# Patient Record
Sex: Female | Born: 1955 | Race: White | Hispanic: No | Marital: Married | Smoking: Former smoker
Health system: Southern US, Community
[De-identification: ages and names within clinical notes are randomized; demographics above are authoritative.]

## PROBLEM LIST (undated history)

## (undated) DIAGNOSIS — K219 Gastro-esophageal reflux disease without esophagitis: Secondary | ICD-10-CM

## (undated) DIAGNOSIS — Z973 Presence of spectacles and contact lenses: Secondary | ICD-10-CM

## (undated) DIAGNOSIS — Z8489 Family history of other specified conditions: Secondary | ICD-10-CM

## (undated) DIAGNOSIS — E7800 Pure hypercholesterolemia, unspecified: Secondary | ICD-10-CM

## (undated) DIAGNOSIS — Z789 Other specified health status: Secondary | ICD-10-CM

## (undated) HISTORY — PX: COLONOSCOPY: SHX174

## (undated) HISTORY — DX: Gastro-esophageal reflux disease without esophagitis: K21.9

## (undated) HISTORY — PX: MOUTH SURGERY: SHX715

## (undated) HISTORY — PX: FINGER SURGERY: SHX640

## (undated) HISTORY — DX: Pure hypercholesterolemia, unspecified: E78.00

---

## 2007-03-18 ENCOUNTER — Other Ambulatory Visit: Admission: RE | Admit: 2007-03-18 | Discharge: 2007-03-18 | Payer: Self-pay | Admitting: Obstetrics & Gynecology

## 2008-03-05 ENCOUNTER — Other Ambulatory Visit: Admission: RE | Admit: 2008-03-05 | Discharge: 2008-03-05 | Payer: Self-pay | Admitting: Obstetrics & Gynecology

## 2009-04-05 ENCOUNTER — Other Ambulatory Visit: Admission: RE | Admit: 2009-04-05 | Discharge: 2009-04-05 | Payer: Self-pay | Admitting: Obstetrics & Gynecology

## 2010-02-27 ENCOUNTER — Ambulatory Visit: Admission: RE | Admit: 2010-02-27 | Discharge: 2010-02-27 | Payer: Self-pay | Admitting: Internal Medicine

## 2013-05-22 ENCOUNTER — Other Ambulatory Visit: Payer: Self-pay | Admitting: Orthopedic Surgery

## 2013-06-06 ENCOUNTER — Encounter (HOSPITAL_BASED_OUTPATIENT_CLINIC_OR_DEPARTMENT_OTHER): Payer: Self-pay | Admitting: *Deleted

## 2013-06-06 NOTE — Progress Notes (Signed)
Pt denies any cardiac or resp problems

## 2013-06-13 ENCOUNTER — Ambulatory Visit (HOSPITAL_BASED_OUTPATIENT_CLINIC_OR_DEPARTMENT_OTHER): Payer: BC Managed Care – PPO | Admitting: Anesthesiology

## 2013-06-13 ENCOUNTER — Encounter (HOSPITAL_BASED_OUTPATIENT_CLINIC_OR_DEPARTMENT_OTHER): Payer: Self-pay | Admitting: *Deleted

## 2013-06-13 ENCOUNTER — Encounter (HOSPITAL_BASED_OUTPATIENT_CLINIC_OR_DEPARTMENT_OTHER)
Admission: RE | Disposition: A | Discharge status: Home / Self Care | Payer: Self-pay | Source: Ambulatory Visit | Attending: Orthopedic Surgery

## 2013-06-13 ENCOUNTER — Ambulatory Visit (HOSPITAL_BASED_OUTPATIENT_CLINIC_OR_DEPARTMENT_OTHER)
Admission: RE | Admit: 2013-06-13 | Discharge: 2013-06-13 | Disposition: A | Discharge status: Home / Self Care | Payer: BC Managed Care – PPO | Source: Ambulatory Visit | Attending: Orthopedic Surgery | Admitting: Orthopedic Surgery

## 2013-06-13 ENCOUNTER — Encounter (HOSPITAL_BASED_OUTPATIENT_CLINIC_OR_DEPARTMENT_OTHER): Payer: Self-pay | Admitting: Anesthesiology

## 2013-06-13 DIAGNOSIS — Z79899 Other long term (current) drug therapy: Secondary | ICD-10-CM | POA: Insufficient documentation

## 2013-06-13 DIAGNOSIS — Z87891 Personal history of nicotine dependence: Secondary | ICD-10-CM | POA: Insufficient documentation

## 2013-06-13 DIAGNOSIS — Z6841 Body Mass Index (BMI) 40.0 and over, adult: Secondary | ICD-10-CM | POA: Insufficient documentation

## 2013-06-13 DIAGNOSIS — D2110 Benign neoplasm of connective and other soft tissue of unspecified upper limb, including shoulder: Secondary | ICD-10-CM | POA: Insufficient documentation

## 2013-06-13 HISTORY — DX: Other specified health status: Z78.9

## 2013-06-13 HISTORY — DX: Family history of other specified conditions: Z84.89

## 2013-06-13 HISTORY — DX: Presence of spectacles and contact lenses: Z97.3

## 2013-06-13 SURGERY — EXCISION METACARPAL MASS
Anesthesia: Regional | Site: Hand | Laterality: Left | Wound class: Clean

## 2013-06-13 MED ORDER — CHLORHEXIDINE GLUCONATE 4 % EX LIQD: 60.0000 mL | Freq: Once | CUTANEOUS | Status: DC

## 2013-06-13 MED ORDER — PROPOFOL INFUSION 10 MG/ML OPTIME
INTRAVENOUS | Status: DC | PRN
  Administered 2013-06-13: 100 ug/kg/min via INTRAVENOUS

## 2013-06-13 MED ORDER — MIDAZOLAM HCL 2 MG/2ML IJ SOLN: 0.5000 mg | Freq: Once | INTRAMUSCULAR | Status: DC | PRN

## 2013-06-13 MED ORDER — HYDROCODONE-ACETAMINOPHEN 5-325 MG PO TABS: 1.0000 | ORAL_TABLET | Freq: Four times a day (QID) | ORAL | Status: DC | PRN

## 2013-06-13 MED ORDER — CEFAZOLIN SODIUM-DEXTROSE 2-3 GM-% IV SOLR: 2.0000 g | INTRAVENOUS | Status: DC

## 2013-06-13 MED ORDER — FENTANYL CITRATE 0.05 MG/ML IJ SOLN: 25.0000 ug | INTRAMUSCULAR | Status: DC | PRN

## 2013-06-13 MED ORDER — PROMETHAZINE HCL 25 MG/ML IJ SOLN: 6.2500 mg | INTRAMUSCULAR | Status: DC | PRN

## 2013-06-13 MED ORDER — MIDAZOLAM HCL 5 MG/5ML IJ SOLN
INTRAMUSCULAR | Status: DC | PRN
  Administered 2013-06-13: 1 mg via INTRAVENOUS

## 2013-06-13 MED ORDER — MEPERIDINE HCL 25 MG/ML IJ SOLN: 6.2500 mg | INTRAMUSCULAR | Status: DC | PRN

## 2013-06-13 MED ORDER — BUPIVACAINE HCL (PF) 0.25 % IJ SOLN
INTRAMUSCULAR | Status: DC | PRN
  Administered 2013-06-13: 7 mL

## 2013-06-13 MED ORDER — FENTANYL CITRATE 0.05 MG/ML IJ SOLN
INTRAMUSCULAR | Status: DC | PRN
  Administered 2013-06-13: 50 ug via INTRAVENOUS

## 2013-06-13 MED ORDER — LACTATED RINGERS IV SOLN
INTRAVENOUS | Status: DC
  Administered 2013-06-13: 08:00:00 via INTRAVENOUS

## 2013-06-13 MED ORDER — OXYCODONE HCL 5 MG PO TABS: 5.0000 mg | ORAL_TABLET | Freq: Once | ORAL | Status: DC | PRN

## 2013-06-13 MED ORDER — LIDOCAINE HCL (CARDIAC) 20 MG/ML IV SOLN
INTRAVENOUS | Status: DC | PRN
  Administered 2013-06-13: 50 mg via INTRAVENOUS

## 2013-06-13 MED ORDER — OXYCODONE HCL 5 MG/5ML PO SOLN: 5.0000 mg | Freq: Once | ORAL | Status: DC | PRN

## 2013-06-13 MED ORDER — ONDANSETRON HCL 4 MG/2ML IJ SOLN
INTRAMUSCULAR | Status: DC | PRN
  Administered 2013-06-13: 4 mg via INTRAVENOUS

## 2013-06-13 SURGICAL SUPPLY — 50 items
BANDAGE COBAN STERILE 2 (GAUZE/BANDAGES/DRESSINGS) IMPLANT
BANDAGE GAUZE ELAST BULKY 4 IN (GAUZE/BANDAGES/DRESSINGS) IMPLANT
BLADE MINI RND TIP GREEN BEAV (BLADE) IMPLANT
BLADE SURG 15 STRL LF DISP TIS (BLADE) ×1 IMPLANT
BLADE SURG 15 STRL SS (BLADE) ×2
BNDG CMPR 9X4 STRL LF SNTH (GAUZE/BANDAGES/DRESSINGS)
BNDG COHESIVE 1X5 TAN STRL LF (GAUZE/BANDAGES/DRESSINGS) ×1 IMPLANT
BNDG COHESIVE 3X5 TAN STRL LF (GAUZE/BANDAGES/DRESSINGS) IMPLANT
BNDG ESMARK 4X9 LF (GAUZE/BANDAGES/DRESSINGS) IMPLANT
CHLORAPREP W/TINT 26ML (MISCELLANEOUS) ×2 IMPLANT
CLOTH BEACON ORANGE TIMEOUT ST (SAFETY) ×2 IMPLANT
CORDS BIPOLAR (ELECTRODE) ×2 IMPLANT
COVER MAYO STAND STRL (DRAPES) ×2 IMPLANT
COVER TABLE BACK 60X90 (DRAPES) ×2 IMPLANT
CUFF TOURNIQUET SINGLE 18IN (TOURNIQUET CUFF) IMPLANT
DECANTER SPIKE VIAL GLASS SM (MISCELLANEOUS) IMPLANT
DRAIN PENROSE 1/2X12 LTX STRL (WOUND CARE) IMPLANT
DRAPE EXTREMITY T 121X128X90 (DRAPE) ×2 IMPLANT
DRAPE SURG 17X23 STRL (DRAPES) ×2 IMPLANT
GAUZE XEROFORM 1X8 LF (GAUZE/BANDAGES/DRESSINGS) ×2 IMPLANT
GLOVE BIO SURGEON STRL SZ 6.5 (GLOVE) ×2 IMPLANT
GLOVE BIOGEL PI IND STRL 8.5 (GLOVE) ×1 IMPLANT
GLOVE BIOGEL PI INDICATOR 8.5 (GLOVE) ×1
GLOVE SURG ORTHO 8.0 STRL STRW (GLOVE) ×2 IMPLANT
GOWN BRE IMP PREV XXLGXLNG (GOWN DISPOSABLE) ×2 IMPLANT
GOWN PREVENTION PLUS XLARGE (GOWN DISPOSABLE) ×2 IMPLANT
NDL SAFETY ECLIPSE 18X1.5 (NEEDLE) ×1 IMPLANT
NEEDLE 27GAX1X1/2 (NEEDLE) ×1 IMPLANT
NEEDLE HYPO 18GX1.5 SHARP (NEEDLE) ×2
NS IRRIG 1000ML POUR BTL (IV SOLUTION) ×2 IMPLANT
PACK BASIN DAY SURGERY FS (CUSTOM PROCEDURE TRAY) ×2 IMPLANT
PAD CAST 3X4 CTTN HI CHSV (CAST SUPPLIES) IMPLANT
PADDING CAST ABS 3INX4YD NS (CAST SUPPLIES)
PADDING CAST ABS 4INX4YD NS (CAST SUPPLIES)
PADDING CAST ABS COTTON 3X4 (CAST SUPPLIES) IMPLANT
PADDING CAST ABS COTTON 4X4 ST (CAST SUPPLIES) ×1 IMPLANT
PADDING CAST COTTON 3X4 STRL (CAST SUPPLIES)
SPLINT FNGR PLAIN END 5/8X3.25 (CAST SUPPLIES) IMPLANT
SPLINT PLASTALUME 3 1/4 (CAST SUPPLIES) ×2
SPLINT PLASTER CAST XFAST 3X15 (CAST SUPPLIES) IMPLANT
SPLINT PLASTER XTRA FASTSET 3X (CAST SUPPLIES)
SPONGE GAUZE 4X4 12PLY (GAUZE/BANDAGES/DRESSINGS) ×2 IMPLANT
STOCKINETTE 4X48 STRL (DRAPES) ×2 IMPLANT
SUT VIC AB 4-0 P2 18 (SUTURE) IMPLANT
SUT VICRYL RAPID 5 0 P 3 (SUTURE) IMPLANT
SUT VICRYL RAPIDE 4/0 PS 2 (SUTURE) ×2 IMPLANT
SYR BULB 3OZ (MISCELLANEOUS) ×2 IMPLANT
SYR CONTROL 10ML LL (SYRINGE) IMPLANT
TOWEL OR 17X24 6PK STRL BLUE (TOWEL DISPOSABLE) ×4 IMPLANT
UNDERPAD 30X30 INCONTINENT (UNDERPADS AND DIAPERS) ×2 IMPLANT

## 2013-06-13 NOTE — Transfer of Care (Signed)
Immediate Anesthesia Transfer of Care Note  Patient: Sarah [name]  Procedure(s) Performed: Procedure(s): EXCISION MASS LEFT INDEX FINGER (Left)  Patient Location: PACU  Anesthesia Type:MAC and Bier block  Level of Consciousness: awake and alert   Airway & Oxygen Therapy: Patient Spontanous Breathing and Patient connected to face mask oxygen  Post-op Assessment: Report given to PACU RN and Post -op Vital signs reviewed and stable  Post vital signs: Reviewed and stable  Complications: No apparent anesthesia complications

## 2013-06-13 NOTE — H&P (Signed)
  Sarah [name] is a 57 year-old right-hand dominant female with a mass on the radial aspect DIP joint of her left index finger.  This has been present for approximately one year.  She recalls no specific history of injury. She has no history of diabetes, thyroid problems, arthritis or gout . She complains of mild, dull aching type pain. She has not had any treatment or tried anything for this.    ALLERGIES:    None.  MEDICATIONS:    Estradiol, medroxyprogesterone and fluoxetine.  SURGICAL HISTORY:    None.  FAMILY MEDICAL HISTORY:    Positive for arthritis.      SOCIAL HISTORY:     She does no smoke or drink.  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  REVIEW OF SYSTEMS:     Positive for glasses, otherwise negative 14 points.  Sarah [name] is an 57 y.o. female.   Chief Complaint: mass LIF  HPI: see above  Past Medical History  Diagnosis Date  . Medical history non-contributory   . Wears glasses   . Family history of anesthesia complication     father hard to wake up    Past Surgical History  Procedure Laterality Date  . Colonoscopy      History reviewed. No pertinent family history. Social History:  reports that she quit smoking about 16 years ago. She does not have any smokeless tobacco history on file. She reports that  drinks alcohol. She reports that she does not use illicit drugs.  Allergies: No Known Allergies  Medications Prior to Admission  Medication Sig Dispense Refill  . estradiol (ESTRACE) 0.5 MG tablet Take 0.5 mg by mouth every evening.      Marland Kitchen FLUoxetine (PROZAC) 10 MG capsule Take 20 mg by mouth every evening.       . medroxyPROGESTERone (PROVERA) 2.5 MG tablet Take 2.5 mg by mouth every evening.        No results found for this or any previous visit (from the past 48 hour(s)).  No results found.   Pertinent items are noted in HPI.  Blood pressure 150/87, pulse 55, temperature 97.5 F (36.4 C), temperature source Oral, resp. rate 18,  height 5\' 7"  (1.702 m), weight 120.022 kg (264 lb 9.6 oz), SpO2 100.00%.  General appearance: alert, cooperative and appears stated age Head: Normocephalic, without obvious abnormality Neck: no JVD Resp: clear to auscultation bilaterally Cardio: regular rate and rhythm, S1, S2 normal, no murmur, click, rub or gallop GI: soft, non-tender; bowel sounds normal; no masses,  no organomegaly Extremities: extremities normal, atraumatic, no cyanosis or edema Pulses: 2+ and symmetric Skin: Skin color, texture, turgor normal. No rashes or lesions Neurologic: Grossly normal Incision/Wound: na  Assessment/Plan RADIOGRAPHS:     X-rays reveal degenerative  changes of  both PIP, DIP joints.  DIAGNOSIS:     Soft tissue tumor unspecified.   RECOMMENDATIONS/PLAN:    She is desirous of having this removed.  We have discussed the possibility of recurrence, injury to arteries, nerves, tendons, incomplete relief of symptoms, dystrophy, and possibility of infection.  This is scheduled for excision mass, left index finger, as an outpatient under regional anesthesia.  Sarah [name] R 06/13/2013, 7:35 AM

## 2013-06-13 NOTE — Anesthesia Postprocedure Evaluation (Signed)
  Anesthesia Post-op Note  Patient: Sarah [name]  Procedure(s) Performed: Procedure(s): EXCISION MASS LEFT INDEX FINGER (Left)  Patient Location: PACU  Anesthesia Type:Bier block  Level of Consciousness: awake, alert , oriented and patient cooperative  Airway and Oxygen Therapy: Patient Spontanous Breathing  Post-op Pain: none  Post-op Assessment: Post-op Vital signs reviewed, Patient's Cardiovascular Status Stable, Respiratory Function Stable, Patent Airway, No signs of Nausea or vomiting and Pain level controlled  Post-op Vital Signs: Reviewed and stable  Complications: No apparent anesthesia complications

## 2013-06-13 NOTE — Brief Op Note (Signed)
06/13/2013  9:03 AM  PATIENT:  Sarah [name]  57 y.o. female  PRE-OPERATIVE DIAGNOSIS:  MASS LEFT INDEX FINGER  POST-OPERATIVE DIAGNOSIS:  MASS LEFT INDEX FINGER  PROCEDURE:  Procedure(s): EXCISION MASS LEFT INDEX FINGER (Left)  SURGEON:  Surgeon(s) and Role:    * Nicki Reaper, MD - Primary  PHYSICIAN ASSISTANT:   ASSISTANTS: none   ANESTHESIA:   local and regional  EBL:     BLOOD ADMINISTERED:none  DRAINS: none   LOCAL MEDICATIONS USED:  MARCAINE     SPECIMEN:  excision  DISPOSITION OF SPECIMEN:  PATHOLOGY  COUNTS:  YES  TOURNIQUET:   Total Tourniquet Time Documented: Forearm (Left) - 23 minutes Total: Forearm (Left) - 23 minutes   DICTATION: .Other Dictation: Dictation Number [id number]  PLAN OF CARE: Discharge to home after PACU  PATIENT DISPOSITION:  PACU - hemodynamically stable.

## 2013-06-13 NOTE — Op Note (Signed)
Dictation Number [id number]

## 2013-06-13 NOTE — Anesthesia Preprocedure Evaluation (Addendum)
Anesthesia Evaluation  Patient identified by MRN, date of birth, ID band Patient awake    Reviewed: Allergy & Precautions, H&P , NPO status , Patient's Chart, lab work & pertinent test results  History of Anesthesia Complications Negative for: history of anesthetic complications  Airway Mallampati: II TM Distance: >3 FB Neck ROM: Full    Dental  (+) Poor Dentition and Dental Advisory Given   Pulmonary former smoker,  breath sounds clear to auscultation  Pulmonary exam normal       Cardiovascular negative cardio ROS  Rhythm:Regular Rate:Normal     Neuro/Psych negative neurological ROS     GI/Hepatic negative GI ROS, Neg liver ROS,   Endo/Other  Morbid obesity  Renal/GU negative Renal ROS     Musculoskeletal   Abdominal (+) + obese,   Peds  Hematology negative hematology ROS (+)   Anesthesia Other Findings   Reproductive/Obstetrics                          Anesthesia Physical Anesthesia Plan  ASA: II  Anesthesia Plan: MAC and Bier Block   Post-op Pain Management:    Induction:   Airway Management Planned: Simple Face Mask  Additional Equipment:   Intra-op Plan:   Post-operative Plan:   Informed Consent: I have reviewed the patients History and Physical, chart, labs and discussed the procedure including the risks, benefits and alternatives for the proposed anesthesia with the patient or authorized representative who has indicated his/her understanding and acceptance.   Dental advisory given  Plan Discussed with: CRNA and Surgeon  Anesthesia Plan Comments: (Plan routine monitors, IV regional Lidocaine with MAC)        Anesthesia Quick Evaluation

## 2013-06-13 NOTE — Addendum Note (Signed)
Addendum created 06/13/13 1018 by Germaine Pomfret, MD   Modules edited: Anesthesia Attestations, Anesthesia Events

## 2013-06-15 NOTE — Op Note (Signed)
NAME:  Sarah [name], Sarah [name]                      ACCOUNT NO.:  MEDICAL RECORD NO.:  LOCATION:                                 FACILITY:  PHYSICIAN:  Cindee Salt, M.D.            DATE OF BIRTH:  DATE OF PROCEDURE:  06/13/2013 DATE OF DISCHARGE:                              OPERATIVE REPORT   PREOPERATIVE DIAGNOSIS:  Mass, left index finger.  POSTOPERATIVE DIAGNOSIS:  Mass, left index finger.  OPERATION:  Excisional biopsy, mass, left index finger.  SURGEON:  Cindee Salt, MD  ANESTHESIA:  Forearm-based IV regional with metacarpal block.  ANESTHESIOLOGISTJean Rosenthal.  HISTORY:  The patient is a 57 year old female with a history of a mass on the radial aspect, distal interphalangeal joint of her left index finger.  She is desirous of excision of this.  Pre, peri, postoperative course have been discussed along with risks and complications.  She is aware there is no guarantee with the surgery; possibility of infection; recurrence of injury to arteries, nerves, tendons, incomplete relief of symptoms, dystrophy.  In the preoperative area, the patient is seen, the extremity marked by both patient and surgeon.  Antibiotic given.  PROCEDURE IN DETAIL:  The patient was brought to the operating room where a forearm-based IV regional anesthetic was carried out without difficulty.  She was prepped using ChloraPrep, supine position with the left arm free.  A 3-minute dry time was allowed.  Time-out taken, confirming patient procedure.  The index finger, left hand was then given metacarpal block with 0.25% Marcaine without epinephrine, 8 mL was used.  An elliptical incision was made over the mass carried down through subcutaneous tissue with blunt and sharp dissection, this was dissected free and sent to Pathology, taking care to protect neurovascular structures.  The wound was irrigated, undermined, and closed with interrupted 5-0 Vicryl Rapide sutures.  Sterile compressive dressing and splint to  the index finger was applied.  On deflation of the tourniquet, the remaining fingers pinked.  She was taken to the recovery room for observation in satisfactory condition.  She will be discharged home to return in 1 week on Norco.          ______________________________ Cindee Salt, M.D.     GK/MEDQ  D:  06/13/2013  T:  06/13/2013  Job:  [id number]

## 2013-08-19 ENCOUNTER — Other Ambulatory Visit: Payer: Self-pay | Admitting: Certified Nurse Midwife

## 2013-08-21 NOTE — Telephone Encounter (Signed)
Pt has aex 10/14

## 2013-08-22 ENCOUNTER — Ambulatory Visit: Payer: Self-pay | Admitting: Certified Nurse Midwife

## 2013-08-25 ENCOUNTER — Encounter: Payer: Self-pay | Admitting: Certified Nurse Midwife

## 2013-08-28 ENCOUNTER — Ambulatory Visit (INDEPENDENT_AMBULATORY_CARE_PROVIDER_SITE_OTHER): Payer: BC Managed Care – PPO | Admitting: Certified Nurse Midwife

## 2013-08-28 ENCOUNTER — Encounter: Payer: Self-pay | Admitting: Certified Nurse Midwife

## 2013-08-28 VITALS — BP 126/72 | HR 68 | Resp 16 | Ht 67.0 in | Wt 268.0 lb

## 2013-08-28 DIAGNOSIS — Z01419 Encounter for gynecological examination (general) (routine) without abnormal findings: Secondary | ICD-10-CM

## 2013-08-28 DIAGNOSIS — Z0000 Encounter for general adult medical examination without abnormal findings: Secondary | ICD-10-CM

## 2013-08-28 DIAGNOSIS — N3946 Mixed incontinence: Secondary | ICD-10-CM

## 2013-08-28 DIAGNOSIS — N951 Menopausal and female climacteric states: Secondary | ICD-10-CM

## 2013-08-28 MED ORDER — ESTRADIOL 0.5 MG PO TABS: 0.5000 mg | ORAL_TABLET | Freq: Every day | ORAL | Status: DC

## 2013-08-28 NOTE — Patient Instructions (Signed)

## 2013-08-28 NOTE — Progress Notes (Signed)
57 y.o. G0P0000 Married Caucasian Fe here for annual exam.  Menopausal on HRT, would like to decrease dose. Denies vaginal bleeding or vaginal dryness. Sees PCP for aex and all labs. Per patient normal. No health issues today, except continues with mixed incontinence at times. Wears a pad most days. Denies frequency, urgency or pain. Denies excessive caffeine use. Still working on weight loss. Ran her first 5K!  Patient's last menstrual period was 05/23/2008.          Sexually active: no  The current method of family planning is post menopausal status.    Exercising: no  exercise Smoker:  no  Health Maintenance: Pap:  08-16-12 ASCUS -HPV MMG: 04-28-13 normal Colonoscopy:  2008 repeat in 10 years BMD:   2010 TDaP:  2009 Labs: none Self breast exam: not done   reports that she quit smoking about 16 years ago. She does not have any smokeless tobacco history on file. She reports that she does not drink alcohol or use illicit drugs.  Past Medical History  Diagnosis Date  . Medical history non-contributory   . Wears glasses   . Family history of anesthesia complication     father hard to wake up  . GERD (gastroesophageal reflux disease)   . Hypercholesteremia     Past Surgical History  Procedure Laterality Date  . Colonoscopy    . Finger surgery      removal of bump on finger, benign    Current Outpatient Prescriptions  Medication Sig Dispense Refill  . estradiol (ESTRACE) 0.5 MG tablet TAKE ONE TABLET DAILY.  90 tablet  0  . FLUoxetine (PROZAC) 20 MG capsule Take 20 mg by mouth daily.      . medroxyPROGESTERone (PROVERA) 2.5 MG tablet Take 2.5 mg by mouth every evening.       No current facility-administered medications for this visit.    Family History  Problem Relation Age of Onset  . Cancer Mother     skin  . Cancer Father     skin  . Stroke Maternal Grandmother   . Stroke Paternal Grandmother     ROS:  Pertinent items are noted in HPI.  Otherwise, a comprehensive  ROS was negative.  Exam:   BP 126/72  Pulse 68  Resp 16  Ht 5\' 7"  (1.702 m)  Wt 268 lb (121.564 kg)  BMI 41.96 kg/m2  LMP 05/23/2008 Height: 5\' 7"  (170.2 cm)  Ht Readings from Last 3 Encounters:  08/28/13 5\' 7"  (1.702 m)  06/13/13 5\' 7"  (1.702 m)  06/13/13 5\' 7"  (1.702 m)    General appearance: alert, cooperative and appears stated age Head: Normocephalic, without obvious abnormality, atraumatic Neck: no adenopathy, supple, symmetrical, trachea midline and thyroid normal to inspection and palpation Lungs: clear to auscultation bilaterally Breasts: normal appearance, no masses or tenderness, No nipple retraction or dimpling, No nipple discharge or bleeding, No axillary or supraclavicular adenopathy Heart: regular rate and rhythm Abdomen: soft, non-tender; no masses,  no organomegaly Extremities: extremities normal, atraumatic, no cyanosis or edema Skin: Skin color, texture, turgor normal. No rashes or lesions Lymph nodes: Cervical, supraclavicular, and axillary nodes normal. No abnormal inguinal nodes palpated Neurologic: Grossly normal   Pelvic: External genitalia:  no lesions              Urethra:  normal appearing urethra with no masses, tenderness or lesions              Bartholin's and Skene's: normal  Vagina: normal appearing vagina with normal color and discharge, no lesions              Cervix: normal, non tender              Pap taken: yes Bimanual Exam:  Uterus:  normal size, contour, position, consistency, mobility, non-tender and anteverted              Adnexa: normal adnexa and no mass, fullness, tenderness               Rectovaginal: Confirms               Anus:  normal sphincter tone, no lesions  A:  Well Woman with normal exam  Menopausal on HRT desires decrease dose  History of ASCUS pap with negaitve HPVHR  Mixed incontinence  P:   Reviewed health and wellness pertinent to exam  Aware of need to evaluate if vaginal bleeding  Discussed  taking 1/2 tablet of Estrace and continuing on same Provera dose. Discussed that symptoms might return but should adjust to after 2-3 weeks. Patient will advise if problems.  Discussed importance of kegel exercise, encouraged to work on and try to limit pad use to decrease skin irritation risk.  ZOX:WRUEA culture  Pap smear as per guidelines   Mammogram yearly pap smear taken today with HPVHR   counseled on breast self exam, mammography screening, use and side effects of HRT, menopause, adequate intake of calcium and vitamin D, diet and exercise, Kegel's exercises  return annually or prn  An After Visit Summary was printed and given to the patient.

## 2013-08-29 ENCOUNTER — Encounter: Payer: Self-pay | Admitting: Certified Nurse Midwife

## 2013-08-29 MED ORDER — MEDROXYPROGESTERONE ACETATE 2.5 MG PO TABS: 2.5000 mg | ORAL_TABLET | Freq: Every evening | ORAL | Status: DC

## 2013-08-29 NOTE — Progress Notes (Signed)
Note reviewed, agree with plan.  Jung Ingerson, MD  

## 2013-08-30 LAB — CULTURE, URINE COMPREHENSIVE
Colony Count: NO GROWTH
Organism ID, Bacteria: NO GROWTH

## 2013-09-28 ENCOUNTER — Other Ambulatory Visit: Payer: Self-pay

## 2013-10-13 ENCOUNTER — Ambulatory Visit: Payer: BC Managed Care – PPO | Admitting: Podiatrist

## 2013-10-13 ENCOUNTER — Encounter: Payer: Self-pay | Admitting: Podiatrist

## 2013-10-13 VITALS — BP 154/88 | HR 64 | Resp 16 | Ht 67.0 in | Wt 270.0 lb

## 2013-10-13 DIAGNOSIS — B351 Tinea unguium: Secondary | ICD-10-CM

## 2013-10-13 NOTE — Progress Notes (Signed)
Patient presents for Laser retreat bilateral hallux nails.  Patient states she has noticed nice improvement in the nails.    Laserering of Toenails bilateral hallux nails carried out at today's visit via Q-switch YAG laser by [employer] at continuous Patient and staff were wearing appropriate laser protective goggles/eyewear Laser device was tested prior to use and safety protocols were followed Frequency of 5 Hz, Level 4, Joules 7.5 delivered.     The patient tolerated the lasering well and will call if any concerns arise.

## 2014-08-30 ENCOUNTER — Encounter: Payer: Self-pay | Admitting: Certified Nurse Midwife

## 2014-08-30 ENCOUNTER — Ambulatory Visit (INDEPENDENT_AMBULATORY_CARE_PROVIDER_SITE_OTHER): Payer: BC Managed Care – PPO | Admitting: Certified Nurse Midwife

## 2014-08-30 VITALS — BP 118/70 | HR 72 | Resp 16 | Ht 66.75 in | Wt 266.0 lb

## 2014-08-30 DIAGNOSIS — Z01419 Encounter for gynecological examination (general) (routine) without abnormal findings: Secondary | ICD-10-CM

## 2014-08-30 DIAGNOSIS — Z0000 Encounter for general adult medical examination without abnormal findings: Secondary | ICD-10-CM

## 2014-08-30 LAB — POCT URINALYSIS DIPSTICK
Bilirubin, UA: NEGATIVE
Glucose, UA: NEGATIVE
KETONES UA: NEGATIVE
Leukocytes, UA: NEGATIVE
Nitrite, UA: NEGATIVE
PH UA: 5
PROTEIN UA: NEGATIVE
RBC UA: NEGATIVE
Urobilinogen, UA: NEGATIVE

## 2014-08-30 MED ORDER — IMIQUIMOD 5 % EX CREA: TOPICAL_CREAM | CUTANEOUS | Status: DC

## 2014-08-30 NOTE — Progress Notes (Signed)
58 y.o. G0P0000 Married Caucasian Fe here for annual exam. Menopausal no HRT. Denies vaginal bleeding or vaginal dryness. Sees PCP twice yearly with labs, all normal. No medications. Patient studying for exams for job. Working on weight loss and exercise with trainer. Determined to continue working on!! No other health issues today.  Patient's last menstrual period was 05/23/2008.          Sexually active: No.  The current method of family planning is post menopausal status.    Exercising: Yes.    workout with trainer Smoker:  no  Health Maintenance: Pap: 08-28-13 neg HPV HR neg MMG:  04-30-14 composition category b, birads category 1:neg Colonoscopy: 2008 f/u 50yrs BMD:   2010 TDaP:  2009 Labs: Poct urine-neg Self breast exam: not done   reports that she quit smoking about 17 years ago. She has never used smokeless tobacco. She reports that she does not drink alcohol or use illicit drugs.  Past Medical History  Diagnosis Date  . Medical history non-contributory   . Wears glasses   . Family history of anesthesia complication     father hard to wake up  . GERD (gastroesophageal reflux disease)   . Hypercholesteremia     Past Surgical History  Procedure Laterality Date  . Colonoscopy    . Finger surgery      removal of bump on finger, benign    No current outpatient prescriptions on file.   No current facility-administered medications for this visit.    Family History  Problem Relation Age of Onset  . Cancer Mother     skin  . Cancer Father     skin  . Stroke Maternal Grandmother   . Stroke Paternal Grandmother     ROS:  Pertinent items are noted in HPI.  Otherwise, a comprehensive ROS was negative.  Exam:   BP 118/70  Pulse 72  Resp 16  Ht 5' 6.75" (1.695 m)  Wt 266 lb (120.657 kg)  BMI 42.00 kg/m2  LMP 05/23/2008 Height: 5' 6.75" (169.5 cm)  Ht Readings from Last 3 Encounters:  08/30/14 5' 6.75" (1.695 m)  10/13/13 5\' 7"  (1.702 m)  08/28/13 5\' 7"  (1.702  m)    General appearance: alert, cooperative and appears stated age Head: Normocephalic, without obvious abnormality, atraumatic Neck: no adenopathy, supple, symmetrical, trachea midline and thyroid normal to inspection and palpation Lungs: clear to auscultation bilaterally Breasts: normal appearance, no masses or tenderness, No nipple retraction or dimpling, No nipple discharge or bleeding, No axillary or supraclavicular adenopathy Heart: regular rate and rhythm Abdomen: soft, non-tender; no masses,  no organomegaly Extremities: extremities normal, atraumatic, no cyanosis or edema Skin: Skin color, texture, turgor normal. No rashes or lesions Lymph nodes: Cervical, supraclavicular, and axillary nodes normal. No abnormal inguinal nodes palpated Neurologic: Grossly normal   Pelvic: External genitalia:  no lesions              Urethra:  normal appearing urethra with no masses, tenderness or lesions              Bartholin's and Skene's: normal                 Vagina: normal appearing vagina with normal color and discharge, no lesions              Cervix: normal,non tender, no lesions              Pap taken: No. Bimanual Exam:  Uterus:  normal size, contour,  position, consistency, mobility, non-tender and midposition              Adnexa: normal adnexa and no mass, fullness, tenderness               Rectovaginal: Confirms               Anus:  normal sphincter tone, no lesions  A:  Well Woman with normal exam  Menopausal  No HRT  Morbid obesity on weight loss and exercise with trainer now  Bone density due  P:   Reviewed health and wellness pertinent to exam  Aware of need to advise if vaginal bleeding  Continue on weight loss aware of benefits  Patient will schedule after exams  Pap smear not taken today   counseled on breast self exam, mammography screening, osteoporosis, adequate intake of calcium and vitamin D, diet and exercise  return annually or prn  An After Visit Summary  was printed and given to the patient.

## 2014-08-30 NOTE — Progress Notes (Signed)
Reviewed personally.  M. Suzanne Austen Wygant, MD.  

## 2014-08-30 NOTE — Patient Instructions (Signed)

## 2014-09-12 ENCOUNTER — Ambulatory Visit (INDEPENDENT_AMBULATORY_CARE_PROVIDER_SITE_OTHER): Payer: BC Managed Care – PPO | Admitting: Podiatrist

## 2014-09-12 ENCOUNTER — Encounter: Payer: Self-pay | Admitting: Podiatrist

## 2014-09-12 ENCOUNTER — Ambulatory Visit (INDEPENDENT_AMBULATORY_CARE_PROVIDER_SITE_OTHER): Payer: BC Managed Care – PPO

## 2014-09-12 ENCOUNTER — Telehealth: Payer: Self-pay | Admitting: *Deleted

## 2014-09-12 VITALS — BP 133/82 | HR 84 | Resp 12

## 2014-09-12 DIAGNOSIS — R52 Pain, unspecified: Secondary | ICD-10-CM

## 2014-09-12 DIAGNOSIS — M2041 Other hammer toe(s) (acquired), right foot: Secondary | ICD-10-CM

## 2014-09-12 DIAGNOSIS — M898X9 Other specified disorders of bone, unspecified site: Secondary | ICD-10-CM

## 2014-09-12 NOTE — Progress Notes (Signed)
   Subjective:    Patient ID: Sarah [name], female    DOB: [birth year], 58 y.o.   MRN: [id number]  HPI PT STATED RT FOOT 4TH AND ACROSS TOP OF THE FOOT IS BEEN SORE 3 MONTHS. THE TOE IS GETTNG WORSE AND IT GET AGGRAVATED BY PRESSURE. TRIED OTC CORN REMOVAL BUT NO HELP.   Review of Systems  All other systems reviewed and are negative.      Objective:   Physical Exam GENERAL APPEARANCE: Alert, conversant. Appropriately groomed. No acute distress.  VASCULAR: Pedal pulses palpable at 2/4 DP and PT bilateral.  Capillary refill time is immediate to all digits,  Proximal to distal cooling it warm to warm.  Digital hair growth is present bilateral  NEUROLOGIC: sensation is intact epicritically and protectively to 5.07 monofilament at 5/5 sites bilateral.  Light touch is intact bilateral, vibratory sensation intact bilateral, achilles tendon reflex is intact bilateral.  MUSCULOSKELETAL: Adductovarus rotation of the right fourth toe is noted. Prominent exostosis is also palpated lateral aspect of fourth toe. Otherwise acceptable muscle strength, tone and stability bilateral.  Intrinsic muscluature intact bilateral.  Rectus appearance of foot noted bilateral.   DERMATOLOGIC: Hyperkeratotic lesion present on the lateral aspect of the right fourth toe. Intact integument is noted. No other dermatologic abnormalities present.     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 Discussed the future the exostosis would likely would need to be shaved down with a in office surgical procedure. She will consider her options for treatment and she will call if she would like to consider having the area shaved.

## 2014-09-12 NOTE — Patient Instructions (Signed)
Corns and Calluses Corns are small areas of thickened skin that usually occur on the top, sides, or tip of a toe. They contain a cone-shaped core with a point that can press on a nerve below. This causes pain. Calluses are areas of thickened skin that usually develop on hands, fingers, palms, soles of the feet, and heels. These are areas that experience frequent friction or pressure. CAUSES  Corns are usually the result of rubbing (friction) or pressure from shoes that are too tight or do not fit properly. Calluses are caused by repeated friction and pressure on the affected areas. SYMPTOMS  A hard growth on the skin.  Pain or tenderness under the skin.  Sometimes, redness and swelling.  Increased discomfort while wearing tight-fitting shoes. DIAGNOSIS  Your caregiver can usually tell what the problem is by doing a physical exam. TREATMENT  Removing the cause of the friction or pressure is usually the only treatment needed. However, sometimes medicines can be used to help soften the hardened, thickened areas. These medicines include salicylic acid plasters and 12% ammonium lactate lotion. These medicines should only be used under the direction of your caregiver. HOME CARE INSTRUCTIONS   Try to remove pressure from the affected area.  You may wear donut-shaped corn pads to protect your skin.  You may use a pumice stone or nonmetallic nail file to gently reduce the thickness of a corn.  Wear properly fitted footwear.  If you have calluses on the hands, wear gloves during activities that cause friction.  If you have diabetes, you should regularly examine your feet. Tell your caregiver if you notice any problems with your feet. SEEK IMMEDIATE MEDICAL CARE IF:   You have increased pain, swelling, redness, or warmth in the affected area.  Your corn or callus starts to drain fluid or bleeds.  You are not getting better, even with treatment. Document Released: 08/15/2004 Document  Revised: 02/01/2012 Document Reviewed: 07/07/2011 Exit[employer] Patient Information 2015 [employer]. This information is not intended to replace advice given to you by your health care provider. Make sure you discuss any questions you have with your health care provider.  

## 2014-09-12 NOTE — Telephone Encounter (Signed)
I just saw Dr. Valentina Lucks about having a procedure done on one of my toes.  I prefer it on a Friday afternoon.  Best number to reach me on is my work phone during the week.  My number is [phone]. [phone] or call on my cell phone.  I'm looking at the 13th or 20th of November.  If you would let me know I'd greatly appreciate it.  Thank you.

## 2014-09-19 NOTE — Telephone Encounter (Signed)
I saw Dr. Valentina Lucks on the 21st.  She was going to get me scheduled to have a procedure done on my toes.  Calling about a procedure Dr. Valentina Lucks needed to get scheduled, something about my toes.  Give me a call back I'd appreciate it.  Thank you.  I returned her call and informed her we can do the office surgery on 10/05/2014.  Dr. Valentina Lucks will not be in the office on 10/12/2014.  She stated, "That is fine.  What time will I need to be there?"  I told her to be here at 3:30pm.  Surgery will be at 4pm.

## 2014-09-19 NOTE — Telephone Encounter (Signed)
Scheduled on Dr. Shaune Pollack and procedure room schedule on Friday 10-05-14 @ 4pm

## 2014-10-03 ENCOUNTER — Telehealth: Payer: Self-pay | Admitting: *Deleted

## 2014-10-03 NOTE — Telephone Encounter (Signed)
I calling about a procedure I'm scheduled for on Friday.  I have questions if you would get back with me I'd greatly appreciate it.

## 2014-10-04 ENCOUNTER — Telehealth: Payer: Self-pay | Admitting: *Deleted

## 2014-10-04 NOTE — Telephone Encounter (Signed)
I have a procedure scheduled for Friday afternoon with Dr. Valentina Lucks.  I have some questions.  I'd appreciate you calling me and let me find out what I need to know.  Thank you.  I spoke to patient this morning 11/03/2014.

## 2014-10-04 NOTE — Telephone Encounter (Signed)
I need to talk to a nurse about a procedure I'm having with Dr. Valentina Lucks on Friday.  I have a few questions I need to know before this afternoon if at all possible.  Thank you.  Already taken care of.

## 2014-10-04 NOTE — Telephone Encounter (Signed)
I called the patient.  "I'm scheduled to have surgery tomorrow.  One person has told me I will need someone to come with me for the surgery and someone else told me I don't need anyone to come with me."  I told her she would be okay.  The toe would be numbed only.  Be careful driving.  "Okay, that's all I needed to know."

## 2014-10-05 ENCOUNTER — Ambulatory Visit (INDEPENDENT_AMBULATORY_CARE_PROVIDER_SITE_OTHER): Payer: BC Managed Care – PPO | Admitting: Podiatrist

## 2014-10-05 ENCOUNTER — Ambulatory Visit: Payer: BC Managed Care – PPO | Admitting: Podiatrist

## 2014-10-05 DIAGNOSIS — M898X9 Other specified disorders of bone, unspecified site: Secondary | ICD-10-CM

## 2014-10-05 DIAGNOSIS — M25774 Osteophyte, right foot: Secondary | ICD-10-CM

## 2014-10-05 DIAGNOSIS — M2041 Other hammer toe(s) (acquired), right foot: Secondary | ICD-10-CM

## 2014-10-05 MED ORDER — HYDROCODONE-ACETAMINOPHEN 5-325 MG PO TABS: 1.0000 | ORAL_TABLET | ORAL | Status: DC | PRN

## 2014-10-05 NOTE — Patient Instructions (Signed)
After Surgery Instructions   1) If you are recuperating from surgery anywhere other than home, please be sure to leave us the number where you can be reached.  2) Go directly home and rest.  3) Keep the operated foot(feet) elevated six inches above the hip when sitting or lying down. This will help control swelling and pain.  4) Support the elevated foot and leg with pillows. DO NOT PLACE PILLOWS UNDER THE KNEE.  5) DO NOT REMOVE or get your bandages WET, unless you were given different instructions by your doctor to do so. This increases the risk of infection.  6) Wear your surgical shoe or surgical boot at all times when you are up on your feet.  7) A limited amount of pain and swelling may occur. The skin may take on a bruised appearance. DO NOT BE ALARMED, THIS IS NORMAL.  8) For slight pain and swelling, apply an ice pack directly over the bandages for 15 minutes only out of each hour of the day. Continue until seen in the office for your first post op visit. DON NOT     APPLY ANY FORM OF HEAT TO THE AREA.  9) Have prescriptions filled immediately and take as directed.  10) Drink lots of liquids, water and juice to stay hydrated.  11) CALL IMMEDIATELY IF:  *Bleeding continues until the following day of surgery  *Pain increases and/or does not respond to medication  *Bandages or cast appears to tight  *If your bandage gets wet  *Trip, fall or stump your surgical foot  *If your temperature goes above 101  *If you have ANY questions at all  YOU NOW CONTROL THE EFFORT OF YOUR RECOVERY. ADHERING TO THESE INSTRUCTIONS WILL OFFER YOU THE MOST COMPLETE RESULTS  

## 2014-10-07 NOTE — Progress Notes (Signed)
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  She relates it is uncomfortable as it presses against the fifth toe. Anesthesia:  Local only consisting of lidocaine and marcaine plain to the toe itself. Hemostasis:  Ankle tourniquet at 212mmhg Materials:  5.0 nylon Procedure:  Patient was positioned in the OR suite in the OR chair and reclined.  The toe was anesthetized under sterile technique.  A pneumatic ankle tourniquet was place about her right ankle.  The right foot and ankle were then scrubbed, prepped and draped in sterile fashion.  The ace bandage was used to exsanguinate the foot and the tourniquet was inflated for hemostatsis.  Attention was directed to the lateral side of the right fourth toe where a small 28mm incision was created.  A periosteal incision was also created and the soft tissue was lifted off the bone.  Using a sterile burr, the exostosis was debrided down.  Copious amounts of saline flushed the incision site.  Nylon was used to close the wound.  A sterile dressing was applied and the tourniquet was released.  She was given instructions for aftercare.  She will be seen back in 2 weeks for recheck.  If any problems or concerns arise she will call.  rx for hydrocodone was written for pain control.

## 2014-10-17 ENCOUNTER — Encounter: Payer: Self-pay | Admitting: Podiatrist

## 2014-10-17 ENCOUNTER — Ambulatory Visit (INDEPENDENT_AMBULATORY_CARE_PROVIDER_SITE_OTHER): Payer: BC Managed Care – PPO | Admitting: Podiatrist

## 2014-10-17 ENCOUNTER — Ambulatory Visit (INDEPENDENT_AMBULATORY_CARE_PROVIDER_SITE_OTHER): Payer: BC Managed Care – PPO

## 2014-10-17 VITALS — BP 129/76 | HR 86 | Resp 12

## 2014-10-17 DIAGNOSIS — M898X9 Other specified disorders of bone, unspecified site: Secondary | ICD-10-CM

## 2014-10-24 ENCOUNTER — Other Ambulatory Visit: Payer: Self-pay | Admitting: Podiatrist

## 2014-10-24 DIAGNOSIS — M898X9 Other specified disorders of bone, unspecified site: Secondary | ICD-10-CM

## 2014-10-29 NOTE — Progress Notes (Signed)
Subjective:  Sarah [name] presents today for follow up of exostectomy 4th toe.  She relates the toe is sore but doing well.  Objective:  Neurovascular status is intact.  Excellent appearance of the 4th toe.  No redness, swelling or infection is present.  Incision line is well coapted with sutures in place.  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  Dispensed coban wrap for her to do the same-- will see her back in 2 weeks for follow up.

## 2014-10-31 ENCOUNTER — Encounter: Payer: Self-pay | Admitting: Podiatrist

## 2014-10-31 ENCOUNTER — Ambulatory Visit (INDEPENDENT_AMBULATORY_CARE_PROVIDER_SITE_OTHER): Payer: BC Managed Care – PPO | Admitting: Podiatrist

## 2014-10-31 ENCOUNTER — Ambulatory Visit (INDEPENDENT_AMBULATORY_CARE_PROVIDER_SITE_OTHER): Payer: BC Managed Care – PPO

## 2014-10-31 VITALS — BP 142/86 | HR 72 | Resp 12

## 2014-10-31 DIAGNOSIS — M2041 Other hammer toe(s) (acquired), right foot: Secondary | ICD-10-CM

## 2014-11-12 NOTE — Progress Notes (Signed)
Subjective:  Sarah [name] presents today for follow up of exostectomy 4th toe.  She relates the toe is still swollen and sore but doing well.  Objective:  Neurovascular status is intact.  Excellent appearance of the 4th toe.  Moderate swelling on the lateral aspect of the toe is present. Sutures lines are completely healed.   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 She may get into whatever shoes are comfortable. I'll see her back for recheck in 4 weeks.

## 2014-11-30 ENCOUNTER — Encounter: Payer: BC Managed Care – PPO | Admitting: Podiatrist

## 2015-03-06 ENCOUNTER — Telehealth: Payer: Self-pay | Admitting: Certified Nurse Midwife

## 2015-03-06 NOTE — Telephone Encounter (Signed)
Provider cx. Left message to cb and rs. Recall entered.

## 2015-09-02 ENCOUNTER — Ambulatory Visit: Payer: Self-pay | Admitting: Certified Nurse Midwife

## 2015-09-03 ENCOUNTER — Ambulatory Visit: Payer: Self-pay | Admitting: Obstetrics & Gynecology

## 2015-09-03 ENCOUNTER — Ambulatory Visit (INDEPENDENT_AMBULATORY_CARE_PROVIDER_SITE_OTHER): Payer: PRIVATE HEALTH INSURANCE | Admitting: Obstetrics & Gynecology

## 2015-09-03 ENCOUNTER — Encounter: Payer: Self-pay | Admitting: Obstetrics & Gynecology

## 2015-09-03 VITALS — BP 170/86 | HR 80 | Resp 20 | Ht 66.0 in | Wt 264.0 lb

## 2015-09-03 DIAGNOSIS — Z01419 Encounter for gynecological examination (general) (routine) without abnormal findings: Secondary | ICD-10-CM | POA: Diagnosis not present

## 2015-09-03 DIAGNOSIS — Z124 Encounter for screening for malignant neoplasm of cervix: Secondary | ICD-10-CM

## 2015-09-03 NOTE — Progress Notes (Signed)
59 y.o. G0P0000 MarriedCaucasianF here for annual exam.  Pt reports she lost her job due to work "rearrangement".  Had and interview today.    Denies vaginal bleeding.    Patient's last menstrual period was 05/23/2008.          Sexually active: No.  The current method of family planning is post menopausal status.    Exercising: No.  not regularly Smoker:  Former smoker  Health Maintenance: Pap:  08/28/13 WNL/negative HR HPV History of abnormal Pap:  no MMG:  05/23/15 3D-BiRads 2-Benign Colonoscopy:  2008-repeat in 10 years BMD:   2010 TDaP:  2009 Screening Labs: PCP, Hb today: PCP, Urine today: PCP   reports that she quit smoking about 18 years ago. She has never used smokeless tobacco. She reports that she drinks alcohol. She reports that she does not use illicit drugs.  Past Medical History  Diagnosis Date  . Medical history non-contributory   . Wears glasses   . Family history of anesthesia complication     father hard to wake up  . GERD (gastroesophageal reflux disease)   . Hypercholesteremia     Past Surgical History  Procedure Laterality Date  . Colonoscopy    . Finger surgery      removal of bump on finger, benign  . Mouth surgery      remove of bone and tooth    No current outpatient prescriptions on file.   No current facility-administered medications for this visit.    Family History  Problem Relation Age of Onset  . Cancer Mother     skin  . Cancer Father     skin  . Stroke Maternal Grandmother   . Stroke Paternal Grandmother     ROS:  Pertinent items are noted in HPI.  Otherwise, a comprehensive ROS was negative.  Exam:   BP 170/86 mmHg  Pulse 80  Resp 20  Ht 5\' 6"  (1.676 m)  Wt 264 lb (119.75 kg)  BMI 42.63 kg/m2  LMP 05/23/2008  Weight change: -2#  Height: 5\' 6"  (167.6 cm)  Ht Readings from Last 3 Encounters:  09/03/15 5\' 6"  (1.676 m)  08/30/14 5' 6.75" (1.695 m)  10/13/13 5\' 7"  (1.702 m)    General appearance: alert, cooperative and  appears stated age Head: Normocephalic, without obvious abnormality, atraumatic Neck: no adenopathy, supple, symmetrical, trachea midline and thyroid normal to inspection and palpation Lungs: clear to auscultation bilaterally Breasts: normal appearance, no masses or tenderness Heart: regular rate and rhythm Abdomen: soft, non-tender; bowel sounds normal; no masses,  no organomegaly Extremities: extremities normal, atraumatic, no cyanosis or edema Skin: Skin color, texture, turgor normal. No rashes or lesions Lymph nodes: Cervical, supraclavicular, and axillary nodes normal. No abnormal inguinal nodes palpated Neurologic: Grossly normal   Pelvic: External genitalia:  no lesions              Urethra:  normal appearing urethra with no masses, tenderness or lesions              Bartholins and Skenes: normal                 Vagina: normal appearing vagina with normal color and discharge, no lesions              Cervix: no lesions              Pap taken: Yes.   Bimanual Exam:  Uterus:  normal size, contour, position, consistency, mobility, non-tender  Adnexa: normal adnexa and no mass, fullness, tenderness               Rectovaginal: Confirms               Anus:  normal sphincter tone, no lesions  Chaperone was present for exam.  A:  Well woman with normal exam PMP no HRT Morbid obesity   P: Mammogram yearly  Labs and vaccines with PCP  Pap smear obtained today.  Neg pap with neg HR HPV 2014.  AEX 1 year or follow up PRN.

## 2015-09-04 LAB — IPS PAP TEST WITH REFLEX TO HPV

## 2016-03-19 ENCOUNTER — Encounter: Payer: Self-pay | Admitting: Podiatry

## 2016-03-19 ENCOUNTER — Ambulatory Visit (INDEPENDENT_AMBULATORY_CARE_PROVIDER_SITE_OTHER): Payer: 59 | Admitting: Podiatry

## 2016-03-19 VITALS — BP 121/76 | HR 66 | Resp 16

## 2016-03-19 DIAGNOSIS — L600 Ingrowing nail: Secondary | ICD-10-CM | POA: Diagnosis not present

## 2016-03-19 NOTE — Progress Notes (Signed)
   Subjective:    Patient ID: Sarah [name], female    DOB: 08/04/1956, 60 y.o.   MRN: [id number]  HPI    Review of Systems  All other systems reviewed and are negative.      Objective:   Physical Exam        Assessment & Plan:

## 2016-03-19 NOTE — Patient Instructions (Signed)

## 2016-03-20 NOTE — Progress Notes (Signed)
Subjective:     Patient ID: Sarah [name], female   DOB: 10-08-1956, 60 y.o.   MRN: [id number]  HPI patient has a damaged left hallux nail that has been utilized as far as topical medications and laser in the past it's not improved it's getting thicker it's getting more painful and creates ingrown toenails   Review of Systems     Objective:   Physical Exam Neurovascular status intact muscle strength adequate with thick brittle nailbeds hallux left that is dystrophic and hard to cut.    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 Explained the procedure and risk and patient wants surgery and today I infiltrated the left hallux 60 mg Xylocaine Marcaine mixture removed the hallux nail exposed matrix and applied phenol 3 applications 30 seconds followed by alcohol lavage and sterile dressing. Gave instructions on soaks and reappoint

## 2016-05-19 ENCOUNTER — Encounter: Payer: Self-pay | Admitting: Obstetrics & Gynecology

## 2016-12-25 ENCOUNTER — Encounter: Payer: Self-pay | Admitting: Obstetrics & Gynecology

## 2016-12-25 ENCOUNTER — Ambulatory Visit (INDEPENDENT_AMBULATORY_CARE_PROVIDER_SITE_OTHER): Payer: Managed Care, Other (non HMO) | Admitting: Obstetrics & Gynecology

## 2016-12-25 VITALS — BP 116/64 | HR 78 | Resp 14 | Ht 67.5 in | Wt 260.0 lb

## 2016-12-25 DIAGNOSIS — Z01419 Encounter for gynecological examination (general) (routine) without abnormal findings: Secondary | ICD-10-CM | POA: Diagnosis not present

## 2016-12-25 DIAGNOSIS — Z124 Encounter for screening for malignant neoplasm of cervix: Secondary | ICD-10-CM

## 2016-12-25 NOTE — Progress Notes (Signed)
61 y.o. G0P0000 MarriedCaucasianF here for annual exam.  Doing well.  Denies vaginal bleeding.    Patient's last menstrual period was 05/23/2008.          Sexually active: No.  The current method of family planning is post menopausal status.    Exercising: Yes.    walking stairs Smoker:  Former smoker  Health Maintenance: Pap:  09/03/15 negative, 08/28/13 negative, HR HPV negative  History of abnormal Pap:  no MMG:  05/07/16 BIRADS 1 negative  Colonoscopy:  2008- repeat 10 years BMD:   2010  TDaP:  02/22/08  Pneumonia vaccine(s):  never Zostavax:   never Hep C testing: donated blood in the last 6 months  Screening Labs: PCP, Hb today: PCP, Urine today: unable to void at this time   reports that she quit smoking about 19 years ago. She has never used smokeless tobacco. She reports that she drinks alcohol. She reports that she does not use drugs.  Past Medical History:  Diagnosis Date  . Family history of anesthesia complication    father hard to wake up  . GERD (gastroesophageal reflux disease)   . Hypercholesteremia   . Medical history non-contributory   . Wears glasses     Past Surgical History:  Procedure Laterality Date  . COLONOSCOPY    . FINGER SURGERY     removal of bump on finger, benign  . MOUTH SURGERY     remove of bone and tooth    No current outpatient prescriptions on file.   No current facility-administered medications for this visit.     Family History  Problem Relation Age of Onset  . Cancer Mother     skin  . Cancer Father     skin  . Stroke Maternal Grandmother   . Stroke Paternal Grandmother     ROS:  Pertinent items are noted in HPI.  Otherwise, a comprehensive ROS was negative.  Exam:   BP 116/64 (BP Location: Right Arm, Patient Position: Sitting, Cuff Size: Large)   Pulse 78   Resp 14   Ht 5' 7.5" (1.715 m)   Wt 260 lb (117.9 kg)   LMP 05/23/2008   BMI 40.12 kg/m   Weight change: -4# Height: 5' 7.5" (171.5 cm)  Ht Readings from Last  3 Encounters:  12/25/16 5' 7.5" (1.715 m)  09/03/15 5\' 6"  (1.676 m)  08/30/14 5' 6.75" (1.695 m)    General appearance: alert, cooperative and appears stated age Head: Normocephalic, without obvious abnormality, atraumatic Neck: no adenopathy, supple, symmetrical, trachea midline and thyroid normal to inspection and palpation Lungs: clear to auscultation bilaterally Breasts: normal appearance, no masses or tenderness Heart: regular rate and rhythm Abdomen: soft, non-tender; bowel sounds normal; no masses,  no organomegaly Extremities: extremities normal, atraumatic, no cyanosis or edema Skin: Skin color, texture, turgor normal. No rashes or lesions Lymph nodes: Cervical, supraclavicular, and axillary nodes normal. No abnormal inguinal nodes palpated Neurologic: Grossly normal   Pelvic: External genitalia:  no lesions              Urethra:  normal appearing urethra with no masses, tenderness or lesions              Bartholins and Skenes: normal                 Vagina: normal appearing vagina with normal color and discharge, no lesions              Cervix: no lesions  Pap taken: Yes.   Bimanual Exam:  Uterus:  normal size, contour, position, consistency, mobility, non-tender              Adnexa: normal adnexa and no mass, fullness, tenderness               Rectovaginal: Confirms               Anus:  normal sphincter tone, no lesions  Chaperone was present for exam.  A:  Well woman with normal exam PMP no HRT Morbid obesity   P: Mammogram yearly Labs and vaccines with Dr. Willey Blade Pap and HR HPV obtained today Declines Shingrix vaccine, for now Follow up 1 year or prn

## 2016-12-26 ENCOUNTER — Encounter: Payer: Self-pay | Admitting: Obstetrics & Gynecology

## 2016-12-28 NOTE — Telephone Encounter (Signed)
Medication refill request: shingles injection  Last AEX:  2/2/218  Next AEX: 12/31/17 SM  Last MMG (if hormonal medication request): 05/07/16 BIRADS1:neg  Refill authorized: please advise.

## 2016-12-30 LAB — IPS PAP TEST WITH HPV

## 2017-05-27 ENCOUNTER — Encounter: Payer: Self-pay | Admitting: Obstetrics & Gynecology

## 2017-07-06 ENCOUNTER — Other Ambulatory Visit (HOSPITAL_COMMUNITY): Payer: Self-pay | Admitting: Internal Medicine

## 2017-07-06 ENCOUNTER — Ambulatory Visit (HOSPITAL_COMMUNITY)
Admission: RE | Admit: 2017-07-06 | Discharge: 2017-07-06 | Disposition: A | Discharge status: Home / Self Care | Payer: 59 | Source: Ambulatory Visit | Attending: Internal Medicine | Admitting: Internal Medicine

## 2017-07-06 DIAGNOSIS — M546 Pain in thoracic spine: Secondary | ICD-10-CM

## 2017-07-06 DIAGNOSIS — R079 Chest pain, unspecified: Secondary | ICD-10-CM | POA: Diagnosis present

## 2017-12-31 ENCOUNTER — Ambulatory Visit (INDEPENDENT_AMBULATORY_CARE_PROVIDER_SITE_OTHER): Payer: PRIVATE HEALTH INSURANCE | Admitting: Obstetrics & Gynecology

## 2017-12-31 ENCOUNTER — Other Ambulatory Visit: Payer: Self-pay

## 2017-12-31 ENCOUNTER — Encounter: Payer: Self-pay | Admitting: Obstetrics & Gynecology

## 2017-12-31 VITALS — BP 132/62 | HR 86 | Resp 16 | Ht 66.5 in | Wt 256.0 lb

## 2017-12-31 DIAGNOSIS — Z23 Encounter for immunization: Secondary | ICD-10-CM

## 2017-12-31 DIAGNOSIS — Z01419 Encounter for gynecological examination (general) (routine) without abnormal findings: Secondary | ICD-10-CM

## 2017-12-31 NOTE — Progress Notes (Signed)
62 y.o. G0P0000 MarriedCaucasianF here for annual exam.  Doing well.  No vaginal bleeding.    PCP:  Dr. Willey Blade.  Had blood work done in June.  LDLs were mildly elevated.  Patient's last menstrual period was 05/23/2008.          Sexually active: No.  The current method of family planning is post menopausal status.    Exercising: Yes.    Indoor cycling Smoker:  Former smoker  Health Maintenance: Pap:  12/25/16 negative, HR HPV negative, 09/03/15 negative  History of abnormal Pap:  no MMG:  05/06/17 BIRADS 1 negative Colonoscopy:  2008- repeat 10 years  BMD:   05/12/17 normal  TDaP:  02/22/08  Pneumonia vaccine(s):  No  Shingrix:   05/24/17, 09/09/17  Hep C testing: donates blood  Screening Labs: PCP, Hb today: PCP, Urine today: not collected   reports that she quit smoking about 20 years ago. she has never used smokeless tobacco. She reports that she drinks alcohol. She reports that she does not use drugs.  Past Medical History:  Diagnosis Date  . Family history of anesthesia complication    father hard to wake up  . GERD (gastroesophageal reflux disease)   . Hypercholesteremia   . Medical history non-contributory   . Wears glasses     Past Surgical History:  Procedure Laterality Date  . COLONOSCOPY    . FINGER SURGERY     removal of bump on finger, benign  . MOUTH SURGERY     remove of bone and tooth    No current outpatient medications on file.   No current facility-administered medications for this visit.     Family History  Problem Relation Age of Onset  . Cancer Mother        skin  . Cancer Father        skin  . Stroke Maternal Grandmother   . Stroke Paternal Grandmother     ROS:  Pertinent items are noted in HPI.  Otherwise, a comprehensive ROS was negative.  Exam:   BP 132/62 (BP Location: Right Arm, Patient Position: Sitting, Cuff Size: Large)   Pulse 86   Resp 16   Ht 5' 6.5" (1.689 m)   Wt 256 lb (116.1 kg)   LMP 05/23/2008   BMI 40.70 kg/m      Height: 5' 6.5" (168.9 cm)  Ht Readings from Last 3 Encounters:  12/31/17 5' 6.5" (1.689 m)  12/25/16 5' 7.5" (1.715 m)  09/03/15 5\' 6"  (1.676 m)    General appearance: alert, cooperative and appears stated age Head: Normocephalic, without obvious abnormality, atraumatic Neck: no adenopathy, supple, symmetrical, trachea midline and thyroid normal to inspection and palpation Lungs: clear to auscultation bilaterally Breasts: normal appearance, no masses or tenderness Heart: regular rate and rhythm Abdomen: soft, non-tender; bowel sounds normal; no masses,  no organomegaly Extremities: extremities normal, atraumatic, no cyanosis or edema Skin: Skin color, texture, turgor normal. No rashes or lesions Lymph nodes: Cervical, supraclavicular, and axillary nodes normal. No abnormal inguinal nodes palpated Neurologic: Grossly normal   Pelvic: External genitalia:  no lesions              Urethra:  normal appearing urethra with no masses, tenderness or lesions              Bartholins and Skenes: normal                 Vagina: normal appearing vagina with normal color and discharge, no lesions  Cervix: no lesions              Pap taken: Yes.   Bimanual Exam:  Uterus:  normal size, contour, position, consistency, mobility, non-tender              Adnexa: normal adnexa and no mass, fullness, tenderness               Rectovaginal: Confirms               Anus:  normal sphincter tone, no lesions  Chaperone was present for exam.  A:  Well Woman with normal exam PMP, no HRT Obesity Mildly elevated LDLs  P:   Mammogram guidelines reviewed.  Doing 3D Tdap due today.  pap smear with HR HPV 2/18.  No pap smear obtained today. Lab work UTD. Return annually or prn

## 2018-06-15 ENCOUNTER — Encounter: Payer: Self-pay | Admitting: Obstetrics & Gynecology

## 2019-01-02 ENCOUNTER — Telehealth: Payer: Self-pay | Admitting: Obstetrics & Gynecology

## 2019-01-02 NOTE — Telephone Encounter (Signed)
Left patient a message to call back to reschedule a future appointment that was cancelled by the provider for her AEX. °

## 2019-01-04 ENCOUNTER — Encounter: Payer: Self-pay | Admitting: Obstetrics & Gynecology

## 2019-01-04 ENCOUNTER — Ambulatory Visit (INDEPENDENT_AMBULATORY_CARE_PROVIDER_SITE_OTHER): Payer: No Typology Code available for payment source | Admitting: Obstetrics & Gynecology

## 2019-01-04 ENCOUNTER — Other Ambulatory Visit: Payer: Self-pay

## 2019-01-04 VITALS — BP 126/80 | HR 76 | Ht 66.5 in | Wt 252.0 lb

## 2019-01-04 DIAGNOSIS — Z01419 Encounter for gynecological examination (general) (routine) without abnormal findings: Secondary | ICD-10-CM | POA: Diagnosis not present

## 2019-01-04 NOTE — Progress Notes (Signed)
63 y.o. G0P0000 Married White or Caucasian female here for annual exam.  Doing well.  Denies vaginal bleeding.  Is thinking about retiring.  Lost job last year and has looked.  PCP:  Dr. Willey Blade.  Blood work was normal.    Patient's last menstrual period was 05/23/2008.          Sexually active: No.  The current method of family planning is post menopausal status.    Exercising: No.   Smoker:  no  Health Maintenance: Pap:  12/25/16 neg. HR HPV:neg  09/03/15 neg   History of abnormal Pap:  no MMG:  05/05/18 BIRADS1:neg  Colonoscopy:  2008 Normal .  D/w pt today.  cologuard discussed as well.  She is considering. BMD:   2018 Normal  TDaP:  2019 Pneumonia vaccine(s):  No Shingrix:   Completed  Hep C testing: PCP Screening Labs: PCP   reports that she quit smoking about 21 years ago. She has never used smokeless tobacco. She reports current alcohol use of about 1.0 standard drinks of alcohol per week. She reports that she does not use drugs.  Past Medical History:  Diagnosis Date  . Family history of anesthesia complication    father hard to wake up  . GERD (gastroesophageal reflux disease)   . Hypercholesteremia   . Medical history non-contributory   . Wears glasses     Past Surgical History:  Procedure Laterality Date  . COLONOSCOPY    . FINGER SURGERY     removal of bump on finger, benign  . MOUTH SURGERY     remove of bone and tooth    No current outpatient medications on file.   No current facility-administered medications for this visit.     Family History  Problem Relation Age of Onset  . Cancer Mother        skin  . Cancer Father        skin  . Stroke Maternal Grandmother   . Stroke Paternal Grandmother     Review of Systems  All other systems reviewed and are negative.   Exam:   Vitals:   01/04/19 0846  BP: 126/80  Pulse: 76    General appearance: alert, cooperative and appears stated age Head: Normocephalic, without obvious abnormality,  atraumatic Neck: no adenopathy, supple, symmetrical, trachea midline and thyroid normal to inspection and palpation Lungs: clear to auscultation bilaterally Breasts: normal appearance, no masses or tenderness Heart: regular rate and rhythm Abdomen: soft, non-tender; bowel sounds normal; no masses,  no organomegaly Extremities: extremities normal, atraumatic, no cyanosis or edema Skin: Skin color, texture, turgor normal. No rashes or lesions Lymph nodes: Cervical, supraclavicular, and axillary nodes normal. No abnormal inguinal nodes palpated Neurologic: Grossly normal   Pelvic: External genitalia:  no lesions              Urethra:  normal appearing urethra with no masses, tenderness or lesions              Bartholins and Skenes: normal                 Vagina: normal appearing vagina with normal color and discharge, no lesions              Cervix: no lesions              Pap taken: No. Bimanual Exam:  Uterus:  normal size, contour, position, consistency, mobility, non-tender  Adnexa: normal adnexa and no mass, fullness, tenderness               Rectovaginal: Confirms               Anus:  normal sphincter tone, no lesions  Chaperone was present for exam.  A:  Well Woman with normal exam PMP, no HRT Obesity Mildly elevated LDLs  P:   Mammogram guidelines reviewed.  Doing 3D. pap smear neg 2/18.  Not indicated today. D/w colonoscopy screening Lab work UTD with Dr. Willey Blade that was done last summer Vaccines are UTD return annually or prn

## 2019-01-06 ENCOUNTER — Ambulatory Visit: Payer: PRIVATE HEALTH INSURANCE | Admitting: Obstetrics & Gynecology

## 2019-04-14 ENCOUNTER — Telehealth: Payer: Self-pay | Admitting: *Deleted

## 2019-04-14 ENCOUNTER — Other Ambulatory Visit: Payer: 59

## 2019-04-14 DIAGNOSIS — Z20822 Contact with and (suspected) exposure to covid-19: Secondary | ICD-10-CM

## 2019-04-14 NOTE — Telephone Encounter (Signed)
Pt's insurance is [facility]  Member # [id number]  And group # [id number]

## 2019-04-14 NOTE — Telephone Encounter (Signed)
Pt called and was scheduled for a covid-19 test for today 04/13/21 at 2:30 at the [facility] in [place].  Pt advised to wear a mask, stay in the car with windows rolled up until ready for testing. Pt voiced understanding.

## 2019-04-20 LAB — NOVEL CORONAVIRUS, NAA: SARS-CoV-2, NAA: NOT DETECTED

## 2019-04-21 ENCOUNTER — Telehealth: Payer: Self-pay | Admitting: Internal Medicine

## 2019-04-21 NOTE — Telephone Encounter (Signed)
Pt. Called to report [facility] e-mailed her negative COVID 19 results.

## 2019-04-26 ENCOUNTER — Other Ambulatory Visit (HOSPITAL_COMMUNITY): Payer: Self-pay | Admitting: Internal Medicine

## 2019-04-26 ENCOUNTER — Ambulatory Visit (HOSPITAL_COMMUNITY)
Admission: RE | Admit: 2019-04-26 | Discharge: 2019-04-26 | Disposition: A | Discharge status: Home / Self Care | Payer: PRIVATE HEALTH INSURANCE | Source: Ambulatory Visit | Attending: Internal Medicine | Admitting: Internal Medicine

## 2019-04-26 ENCOUNTER — Other Ambulatory Visit: Payer: Self-pay

## 2019-04-26 DIAGNOSIS — R0789 Other chest pain: Secondary | ICD-10-CM

## 2019-05-11 ENCOUNTER — Encounter: Payer: Self-pay | Admitting: Obstetrics & Gynecology

## 2019-05-23 ENCOUNTER — Other Ambulatory Visit: Payer: Self-pay

## 2019-05-23 ENCOUNTER — Ambulatory Visit (HOSPITAL_COMMUNITY)
Admission: RE | Admit: 2019-05-23 | Discharge: 2019-05-23 | Disposition: A | Discharge status: Home / Self Care | Payer: PRIVATE HEALTH INSURANCE | Source: Ambulatory Visit | Attending: Internal Medicine | Admitting: Internal Medicine

## 2019-05-23 ENCOUNTER — Other Ambulatory Visit (HOSPITAL_COMMUNITY): Payer: Self-pay | Admitting: Internal Medicine

## 2019-05-23 DIAGNOSIS — J189 Pneumonia, unspecified organism: Secondary | ICD-10-CM | POA: Diagnosis not present

## 2019-07-03 ENCOUNTER — Ambulatory Visit (HOSPITAL_COMMUNITY)
Admission: RE | Admit: 2019-07-03 | Discharge: 2019-07-03 | Disposition: A | Discharge status: Home / Self Care | Payer: PRIVATE HEALTH INSURANCE | Source: Ambulatory Visit | Attending: Internal Medicine | Admitting: Internal Medicine

## 2019-07-03 ENCOUNTER — Other Ambulatory Visit (HOSPITAL_COMMUNITY): Payer: Self-pay | Admitting: Internal Medicine

## 2019-07-03 ENCOUNTER — Other Ambulatory Visit: Payer: Self-pay

## 2019-07-03 DIAGNOSIS — J189 Pneumonia, unspecified organism: Secondary | ICD-10-CM

## 2019-12-28 LAB — COLOGUARD

## 2020-01-03 ENCOUNTER — Other Ambulatory Visit: Payer: Self-pay

## 2020-01-08 ENCOUNTER — Ambulatory Visit: Payer: 59 | Admitting: Obstetrics & Gynecology

## 2020-01-17 ENCOUNTER — Encounter: Payer: Self-pay | Admitting: Gastroenterology

## 2020-01-26 ENCOUNTER — Other Ambulatory Visit: Payer: Self-pay

## 2020-01-29 ENCOUNTER — Ambulatory Visit (INDEPENDENT_AMBULATORY_CARE_PROVIDER_SITE_OTHER): Payer: 59 | Admitting: Obstetrics & Gynecology

## 2020-01-29 ENCOUNTER — Other Ambulatory Visit (HOSPITAL_COMMUNITY)
Admission: RE | Admit: 2020-01-29 | Discharge: 2020-01-29 | Disposition: A | Discharge status: Home / Self Care | Payer: 59 | Source: Ambulatory Visit | Attending: Obstetrics & Gynecology | Admitting: Obstetrics & Gynecology

## 2020-01-29 ENCOUNTER — Other Ambulatory Visit: Payer: Self-pay

## 2020-01-29 ENCOUNTER — Encounter: Payer: Self-pay | Admitting: Obstetrics & Gynecology

## 2020-01-29 VITALS — BP 118/70 | HR 84 | Temp 98.4°F | Resp 12 | Ht 66.25 in | Wt 254.0 lb

## 2020-01-29 DIAGNOSIS — Z124 Encounter for screening for malignant neoplasm of cervix: Secondary | ICD-10-CM

## 2020-01-29 DIAGNOSIS — Z01419 Encounter for gynecological examination (general) (routine) without abnormal findings: Secondary | ICD-10-CM | POA: Diagnosis not present

## 2020-01-29 DIAGNOSIS — R413 Other amnesia: Secondary | ICD-10-CM | POA: Diagnosis not present

## 2020-01-29 DIAGNOSIS — Z820 Family history of epilepsy and other diseases of the nervous system: Secondary | ICD-10-CM | POA: Diagnosis not present

## 2020-01-29 NOTE — Progress Notes (Signed)
64 y.o. G0P0000 Married White or Caucasian female here for annual exam.  Doing well.  Had colonoscopy in Feb.  Had large polyp removed.  It was benign.  3 year follow up recommended.  Reports she is having some memory issues.  Feels like she gets confused at times.  Did see Dr. Willey Blade about this.  Has done blood work.  Per her description, mini-mental status exam was done and she could not remember presidents or count back from 100 by 7s.  She also is having trouble with getting places.  Using GPS regularly so she doesn't get lost.    Denies vaginal bleeding.  Just started taking phentermine in January.  She is down 10 pounds.  Patient's last menstrual period was 05/23/2008.          Sexually active: No.  The current method of family planning is post menopausal status.    Exercising: No.  The patient does not participate in regular exercise at present. Smoker:  no  Health Maintenance: Pap:  12/25/16 neg. HR HPV:neg             09/03/15 neg    History of abnormal Pap:  no MMG:  05/11/19 BIRADS 1 negative/density b Colonoscopy:  01/20/19 f/u 3 years due to large benign polyp BMD:   2018 Normal  TDaP:  12/2017 Pneumonia vaccine(s):  2020 Shingrix:   completed Hep C testing: PCP Screening Labs: PCP   reports that she quit smoking about 22 years ago. She has never used smokeless tobacco. She reports current alcohol use of about 1.0 standard drinks of alcohol per week. She reports that she does not use drugs.  Past Medical History:  Diagnosis Date  . Family history of anesthesia complication    father hard to wake up  . GERD (gastroesophageal reflux disease)   . Hypercholesteremia   . Medical history non-contributory   . Wears glasses     Past Surgical History:  Procedure Laterality Date  . COLONOSCOPY    . FINGER SURGERY     removal of bump on finger, benign  . MOUTH SURGERY     remove of bone and tooth    Current Outpatient Medications  Medication Sig Dispense Refill  .  cholecalciferol (VITAMIN D3) 25 MCG (1000 UNIT) tablet Take 1,000 Units by mouth daily.    . Multiple Vitamin (MULTIVITAMIN) tablet Take 1 tablet by mouth daily.    . phentermine 15 MG capsule Take 15 mg by mouth daily.     No current facility-administered medications for this visit.    Family History  Problem Relation Age of Onset  . Cancer Mother        skin  . Cancer Father        skin  . Stroke Maternal Grandmother   . Stroke Paternal Grandmother     Review of Systems  All other systems reviewed and are negative.   Exam:   BP 118/70 (BP Location: Right Arm, Patient Position: Sitting, Cuff Size: Large)   Pulse 84   Temp 98.4 F (36.9 C) (Temporal)   Resp 12   Ht 5' 6.25" (1.683 m)   Wt 254 lb (115.2 kg)   LMP 05/23/2008   BMI 40.69 kg/m     Height: 5' 6.25" (168.3 cm)  Ht Readings from Last 3 Encounters:  01/29/20 5' 6.25" (1.683 m)  01/04/19 5' 6.5" (1.689 m)  12/31/17 5' 6.5" (1.689 m)    General appearance: alert, cooperative and appears stated age Head:  Normocephalic, without obvious abnormality, atraumatic Neck: no adenopathy, supple, symmetrical, trachea midline and thyroid normal to inspection and palpation Lungs: clear to auscultation bilaterally Breasts: normal appearance, no masses or tenderness Heart: regular rate and rhythm Abdomen: soft, non-tender; bowel sounds normal; no masses,  no organomegaly Extremities: extremities normal, atraumatic, no cyanosis or edema Skin: Skin color, texture, turgor normal. No rashes or lesions Lymph nodes: Cervical, supraclavicular, and axillary nodes normal. No abnormal inguinal nodes palpated Neurologic: Grossly normal   Pelvic: External genitalia:  no lesions              Urethra:  normal appearing urethra with no masses, tenderness or lesions              Bartholins and Skenes: normal                 Vagina: normal appearing vagina with normal color and discharge, no lesions              Cervix: no lesions               Pap taken: Yes.   Bimanual Exam:  Uterus:  normal size, contour, position, consistency, mobility, non-tender              Adnexa: normal adnexa and no mass, fullness, tenderness               Rectovaginal: Confirms               Anus:  normal sphincter tone, no lesions  Chaperone, Terence Lux, CMA, was present for exam.  A:  Well Woman with normal exam PMP, no HRT Obesity, working on weight loss Mildly elevated LDLs Memory concerns with family of Alzheimer's in her father  P:   Mammogram guidelines reviewed.  Doing 3D MMG. pap smear with HR HPV obtained today Referral to neurology placed today Lab work done with Dr. Willey Blade Vaccines reviewed Return annually or prn

## 2020-01-29 NOTE — Patient Instructions (Signed)
Covid vaccination [place]:  [id number] (Option 2) from 8:00 am-5:00 pm

## 2020-01-31 LAB — CYTOLOGY - PAP
Comment: NEGATIVE
Diagnosis: NEGATIVE
High risk HPV: NEGATIVE

## 2020-02-23 IMAGING — DX CHEST - 2 VIEW
2 series · 2 of 2 positions shown · non-contrast
Comparison: Radiographs April 26, 2019.

CLINICAL DATA: Cough, shortness of breath, pneumonia.

EXAM:
CHEST - 2 VIEW

[chest pa]
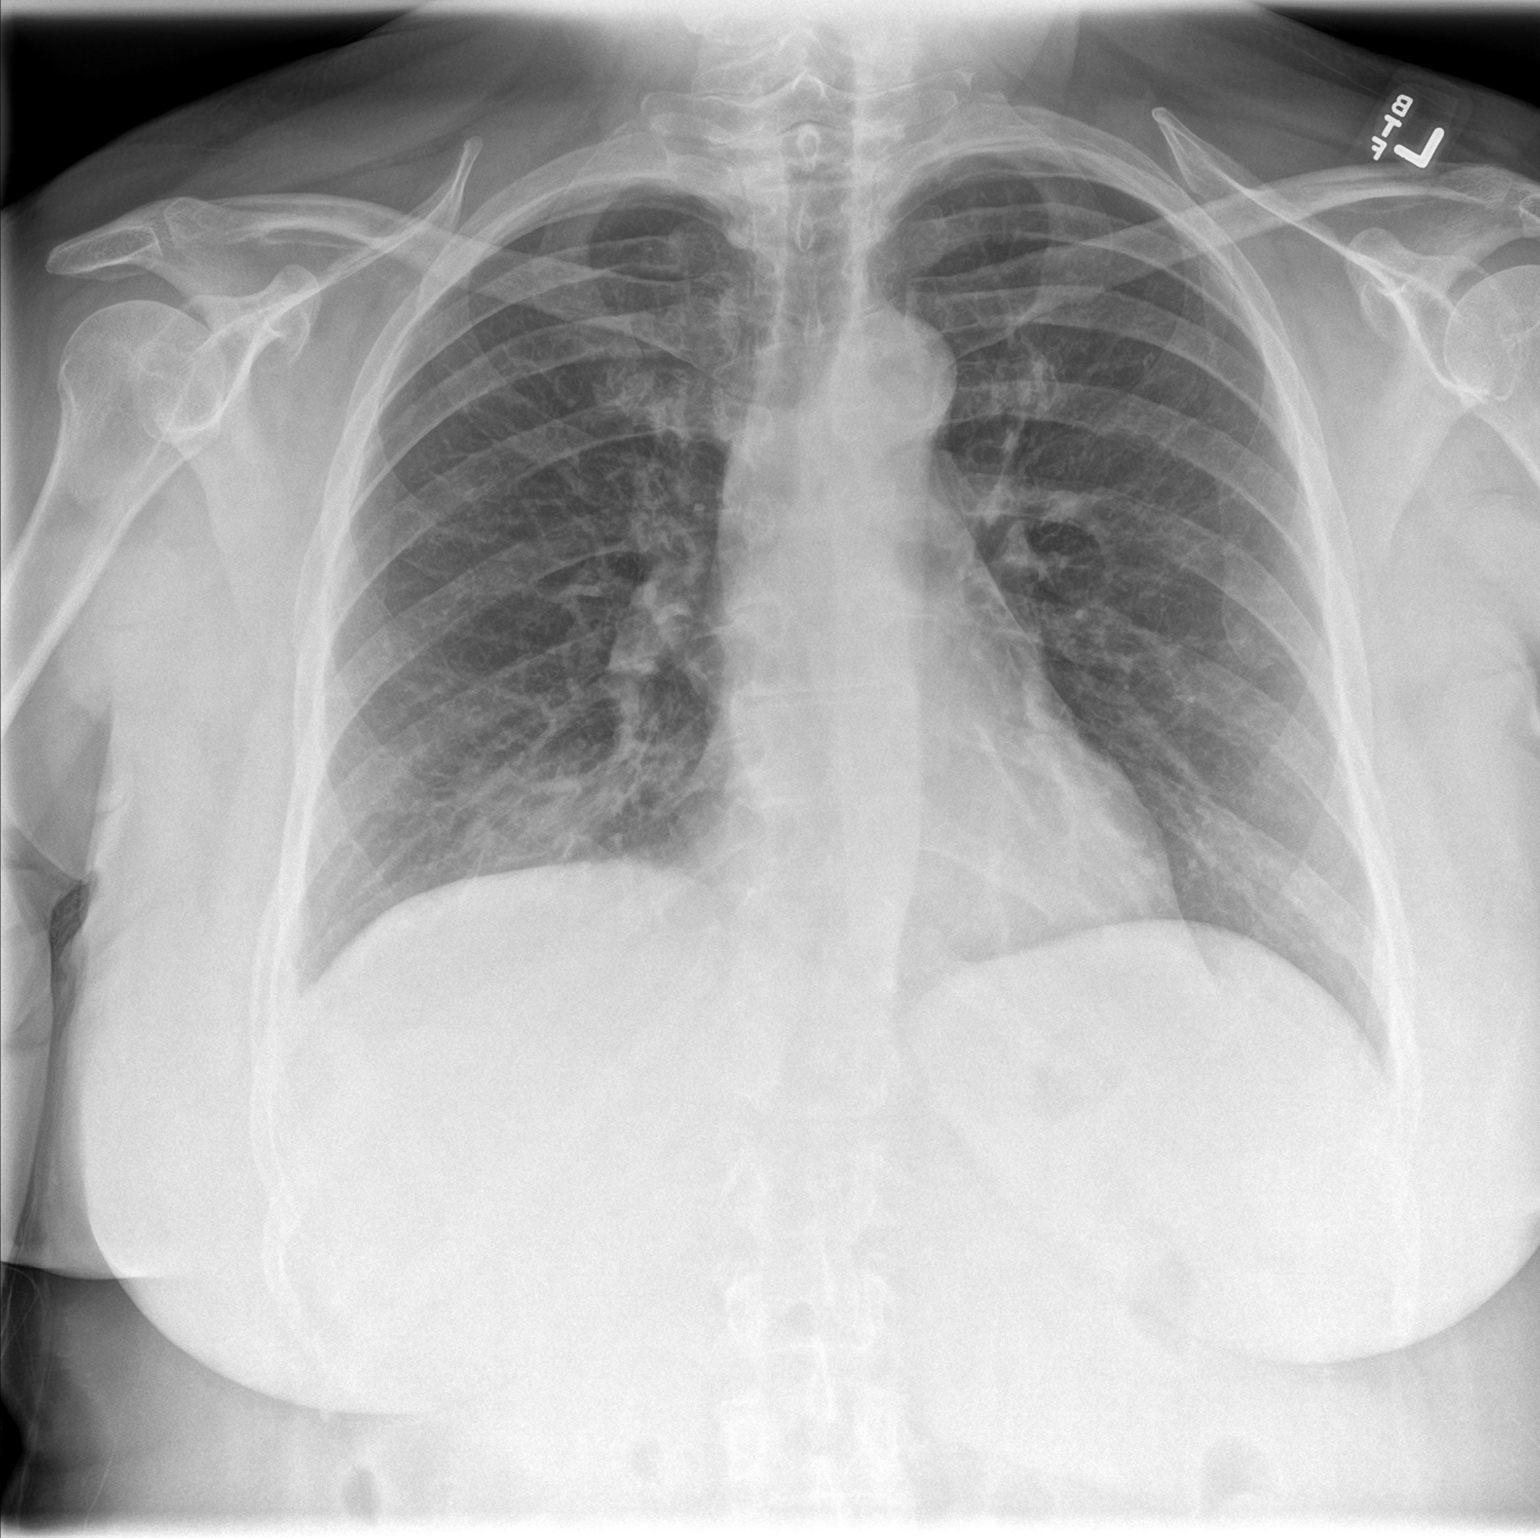

[chest lat]
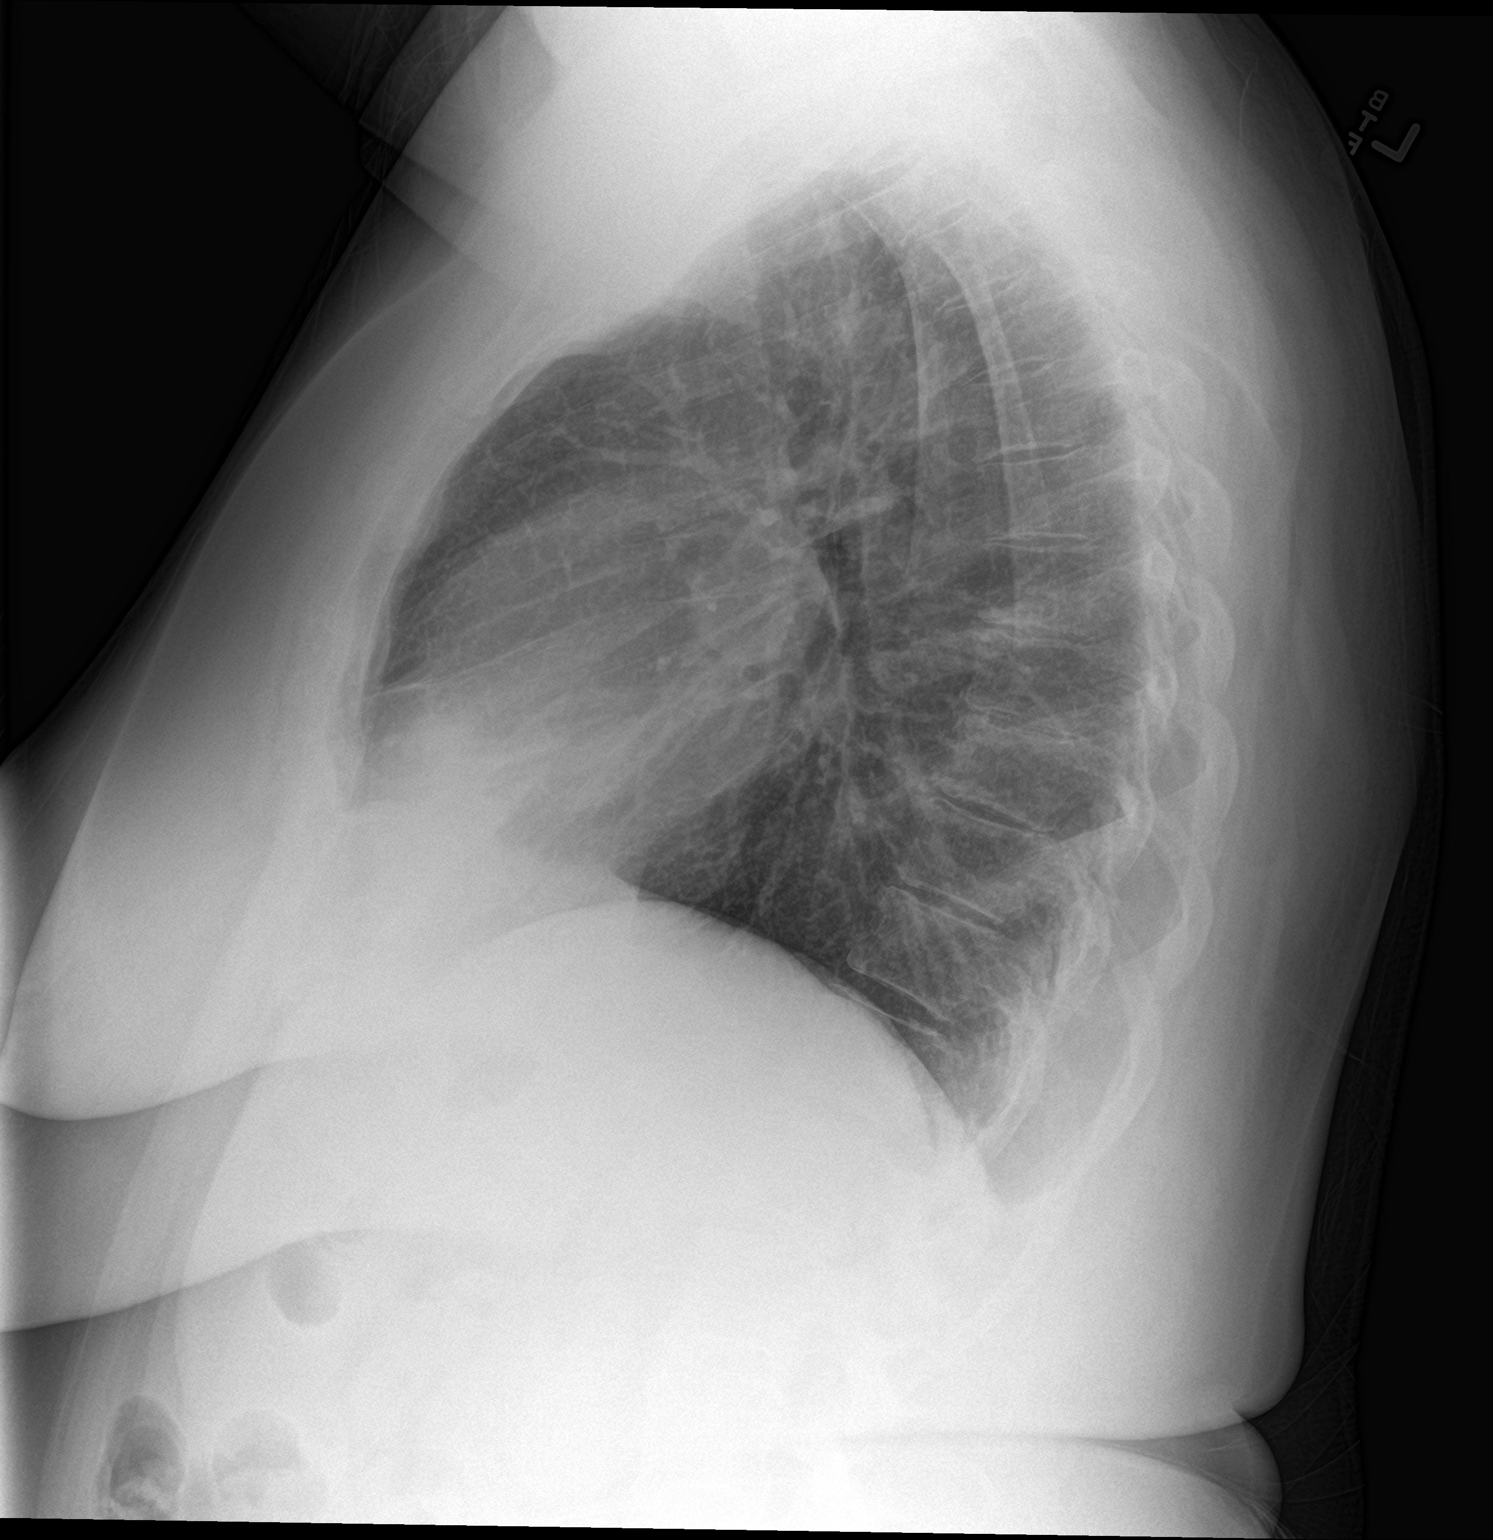

[2 of 2 positions shown; findings below may reference images not displayed]

FINDINGS: The heart size and mediastinal contours are within normal limits. No
pneumothorax or pleural effusion is noted. Left lung is clear.
Decreased right middle lobe opacity is noted consistent with
improving pneumonia. The visualized skeletal structures are
unremarkable.
IMPRESSION: Mildly decreased right middle lobe opacity is noted suggesting
improving pneumonia. Continued radiographic follow-up is recommended
to ensure resolution.

## 2020-03-05 ENCOUNTER — Encounter: Payer: Self-pay | Admitting: Neurology

## 2020-03-05 ENCOUNTER — Other Ambulatory Visit: Payer: Self-pay

## 2020-03-05 ENCOUNTER — Ambulatory Visit (INDEPENDENT_AMBULATORY_CARE_PROVIDER_SITE_OTHER): Payer: 59 | Admitting: Neurology

## 2020-03-05 VITALS — BP 146/99 | HR 78 | Temp 98.6°F | Ht 67.0 in | Wt 252.0 lb

## 2020-03-05 DIAGNOSIS — R413 Other amnesia: Secondary | ICD-10-CM

## 2020-03-05 DIAGNOSIS — R351 Nocturia: Secondary | ICD-10-CM | POA: Diagnosis not present

## 2020-03-05 DIAGNOSIS — E669 Obesity, unspecified: Secondary | ICD-10-CM | POA: Diagnosis not present

## 2020-03-05 DIAGNOSIS — G4719 Other hypersomnia: Secondary | ICD-10-CM | POA: Diagnosis not present

## 2020-03-05 NOTE — Patient Instructions (Signed)
You have complaints of memory loss: memory loss or changes in cognitive function can have many reasons and does not always mean you have dementia. Conditions that can contribute to subjective or objective memory loss include: depression, stress, poor sleep from insomnia or sleep apnea, dehydration, fluctuation in blood sugar values, thyroid or electrolyte dysfunction and certain vitamin deficiencies. Dementia can be caused by stroke, brain atherosclerosis or brain vascular disease due to vascular risk factors (smoking, high blood pressure, high cholesterol, obesity and uncontrolled diabetes), certain degenerative brain disorders (including Parkinson's disease and Multiple sclerosis) and by Alzheimer's disease or other, more rare and sometimes hereditary causes. We will do some additional testing: blood work (which we will do today) and we will pursue a brain scan, called MRI and call you with the test results. We will have to schedule you for this on a separate date. This test requires authorization from your insurance, and we will take care of the insurance process. We will do a sleep study to help rule out obstructive sleep apnea.  We may also request a formal cognitive test called neuropsychological evaluation which is done by a licensed neuropsychologist.   For now, we will call you with your test results and monitor your symptoms.

## 2020-03-05 NOTE — Progress Notes (Signed)
Subjective:    Patient ID: Sarah [name] is a 64 y.o. female.  HPI     Star Age, MD, PhD [facility] [address], [address] [place], [place] [address]  Dear Dr. Sabra Heck,  I saw your patient, Sarah [name], upon your kind request in my neurologic clinic today for initial consultation of her memory loss.  The patient is unaccompanied today.  As you know, Sarah [name] is a 64 year old right-handed woman with an underlying medical history of hyperlipidemia, reflux disease, prior smoking, and morbid obesity with a BMI of over 40, who reports forgetfulness and short-term memory issues for the past 6 months, she had confusion and got lost driving 2 times. She has no underlying mood d/o, or confusion at home, no personality changes, no hallucinations. Her Father had Alzheimers and lived to be 73 yo, mom had a gait and balance d/o, both GMs had dementia. Patient has 2 older sisters and 2 younger brothers, none with memory issues. I reviewed your office note from 01/29/2020. She had blood work through her primary care physician's office on 12/06/2019 and brought a copy for my review, CMP was unremarkable, creatinine was 1.09 and BUN 15, borderline GFR at 54, slightly reduced.  Urinalysis was negative, CBC showed WBC of 6.2, normal platelets, lipid panel showed total cholesterol of 230, triglycerides 197, LDL 155, uric acid was mildly elevated at 7.8.  She reports some daytime somnolence, Epworth sleepiness score is 9 out of 24.  Some years ago she had a sleep study because she was sleepy at the wheel.  She was not diagnosed with obstructive sleep apnea and has never been on CPAP therapy.  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  She lives with her husband, they have no children, she quit smoking in 2005, she drinks alcohol very rarely, and caffeine in the form of coffee, 2 cups/day.  She tries to hydrate well with water.  She had an eye examination in  February and has prescription eyeglasses.  She has nocturia about once per average night, bedtime is typically late, around 1 AM and rise time between 7 and 9.  She has never had a brain MRI.  She does not exercise very much but is planning to increase her physical activity.   Her Past Medical History Is Significant For: Past Medical History:  Diagnosis Date  . Family history of anesthesia complication    father hard to wake up  . GERD (gastroesophageal reflux disease)   . Hypercholesteremia   . Medical history non-contributory   . Wears glasses     Her Past Surgical History Is Significant For: Past Surgical History:  Procedure Laterality Date  . COLONOSCOPY    . FINGER SURGERY     removal of bump on finger, benign  . MOUTH SURGERY     remove of bone and tooth    Her Family History Is Significant For: Family History  Problem Relation Age of Onset  . Cancer Mother        skin  . Cancer Father        skin  . Stroke Maternal Grandmother   . Stroke Paternal Grandmother     Her Social History Is Significant For: Social History   Socioeconomic History  . Marital status: Married    Spouse name: Not on file  . Number of children: Not on file  . Years of education: Not on file  . Highest education level:  Not on file  Occupational History  . Not on file  Tobacco Use  . Smoking status: Former Smoker    Quit date: 06/06/1997    Years since quitting: 22.7  . Smokeless tobacco: Never Used  Substance and Sexual Activity  . Alcohol use: Yes    Alcohol/week: 1.0 standard drinks    Types: 1 Cans of beer per week  . Drug use: No  . Sexual activity: Not Currently    Partners: Male  Other Topics Concern  . Not on file  Social History Narrative  . Not on file   Social Determinants of Health   Financial Resource Strain:   . Difficulty of Paying Living Expenses:   Food Insecurity:   . Worried About Charity fundraiser in the Last Year:   . Arboriculturist in the Last Year:    Transportation Needs:   . Film/video editor (Medical):   Marland Kitchen Lack of Transportation (Non-Medical):   Physical Activity:   . Days of Exercise per Week:   . Minutes of Exercise per Session:   Stress:   . Feeling of Stress :   Social Connections:   . Frequency of Communication with Friends and Family:   . Frequency of Social Gatherings with Friends and Family:   . Attends Religious Services:   . Active Member of Clubs or Organizations:   . Attends Archivist Meetings:   Marland Kitchen Marital Status:     Her Allergies Are:  Allergies  Allergen Reactions  . Codeine Itching  :   Her Current Medications Are:  Outpatient Encounter Medications as of 03/05/2020  Medication Sig  . cholecalciferol (VITAMIN D3) 25 MCG (1000 UNIT) tablet Take 1,000 Units by mouth daily.  . Multiple Vitamin (MULTIVITAMIN) tablet Take 1 tablet by mouth daily.  . phentermine 15 MG capsule Take 15 mg by mouth daily.   No facility-administered encounter medications on file as of 03/05/2020.  :   Review of Systems:  Out of a complete 14 point review of systems, all are reviewed and negative with the exception of these symptoms as listed below:    Review of Systems  Constitutional: Negative.   HENT: Negative.   Eyes: Negative.   Respiratory: Negative.   Cardiovascular: Negative.   Gastrointestinal: Negative.   Endocrine: Negative.   Genitourinary: Negative.   Musculoskeletal: Negative.   Allergic/Immunologic: Negative.   Neurological:       Memory loss   Hematological: Negative.   Psychiatric/Behavioral: Positive for sleep disturbance.       Anxiety    Objective:  Neurological Exam  Physical Exam Physical Examination:   Vitals:   03/05/20 1426  BP: (!) 146/99  Pulse: 78  Temp: 98.6 F (37 C)    General Examination: The patient is a very pleasant 63 y.o. female in no acute distress. She appears well-developed and well-nourished and well groomed.   HEENT: Normocephalic,  atraumatic, pupils are equal, round and reactive to light and accommodation. Funduscopic exam is normal with sharp disc margins noted. Extraocular tracking is good without limitation to gaze excursion or nystagmus noted. Normal smooth pursuit is noted. Hearing is grossly intact. Tympanic membranes are clear bilaterally. Face is symmetric with normal facial animation and normal facial sensation. Speech is clear with no dysarthria noted. There is no hypophonia. There is no lip, neck/head, jaw or voice tremor. Neck is supple with full range of passive and active motion. There are no carotid bruits on auscultation. Oropharynx exam reveals:  mild mouth dryness, good dental hygiene and moderate airway crowding, due to redundant soft palate, tonsillar size of 1+, Mallampati class II, neck circumference of 16-1/4 inches.  Tongue protrudes centrally in palate elevates symmetrically.  Chest: Clear to auscultation without wheezing, rhonchi or crackles noted.  Heart: S1+S2+0, regular and normal without murmurs, rubs or gallops noted.   Abdomen: Soft, non-tender and non-distended with normal bowel sounds appreciated on auscultation.  Extremities: There is trace pitting edema in the distal lower extremities bilaterally. Pedal pulses are intact.  Skin: Warm and dry without trophic changes noted.  Musculoskeletal: exam reveals no obvious joint deformities, tenderness or joint swelling or erythema.   Neurologically:  Mental status: The patient is awake, alert and oriented in all 4 spheres. Her immediate and remote memory, attention, language skills and fund of knowledge are appropriate. There is no evidence of aphasia, agnosia, apraxia or anomia. Speech is clear with normal prosody and enunciation. Thought process is linear. Mood is normal and affect is normal.  On 03/05/2020: MOCA 26/30 (she lost 1 point on orientation, 2 points on serial sevens, one-point on remote recall), CDT 4/4, AFT: 13/min.  Cranial nerves II  - XII are as described above under HEENT exam. In addition: shoulder shrug is normal with equal shoulder height noted. Motor exam: Normal bulk, strength and tone is noted. There is no drift, tremor or rebound. Romberg is negative. Reflexes are 1+ throughout. Babinski: Toes are flexor bilaterally. Fine motor skills and coordination: intact with normal finger taps, normal hand movements, normal rapid alternating patting, normal foot taps and normal foot agility.  Cerebellar testing: No dysmetria or intention tremor on finger to nose testing. Heel to shin is unremarkable bilaterally. There is no truncal or gait ataxia.  Sensory exam: intact to light touch, pinprick, vibration, temperature sense in the upper and lower extremities.  Gait, station and balance: She stands easily. No veering to one side is noted. No leaning to one side is noted. Posture is age-appropriate and stance is narrow based. Gait shows normal stride length and normal pace. No problems turning are noted. Tandem walk is unremarkable.               Assessment and Plan:   In summary, Sarah [name] is a very pleasant 64 y.o.-year old female with an underlying medical history of hyperlipidemia, reflux disease, prior smoking, and morbid obesity with a BMI of over 40, who presents for evaluation of her memory loss, particularly short-term memory issues, forgetfulness and she also reports 2 recent episodes of getting lost while driving in [place] which she is typically very familiar with.  She reports a family history of Alzheimer's disease affecting her father and both grandmothers had dementia.  Her memory scores are slightly abnormal, we can continue to monitor these.  I suggested further work-up in the form of blood work to look for any treatable causes of memory issues, furthermore, I would like to proceed with a brain MRI with and without contrast and proceed with a sleep study to rule out underlying sleep disordered breathing as obstructive  sleep apnea can result in cognitive symptoms and decline.  For now, we will keep her posted as to her test results.  She is advised to stay well-hydrated, well rested, increase her physical activity.  I plan to see her back after these tests.  If she has obstructive sleep apnea, we will consider treatment with AutoPap or CPAP.  Furthermore, we can consider more in-depth memory evaluation with the  help of a licensed neuropsychologist for cognitive testing.  We will pick up our discussion after initial testing.  I answered all her questions today and she was in agreement.   Thank you very much for allowing me to participate in the care of this nice patient. If I can be of any further assistance to you please do not hesitate to call me at [phone].  Sincerely,   Star Age, MD, PhD

## 2020-03-06 ENCOUNTER — Telehealth: Payer: Self-pay | Admitting: Neurology

## 2020-03-06 NOTE — Telephone Encounter (Signed)
Patient called back and stated she would like to go to [facility] is pending I uploaded notes on their portal.

## 2020-03-06 NOTE — Telephone Encounter (Signed)
[facility] Sarah [name]: [id number] (exp. 03/06/20 to 06/04/20).  Patient is scheduled at [facility] for 04/02/20 arrival time is 10:30 AM. I left a voicemail informing the patient this information I also left their number of [phone] incase she needed to reschedule..  Also she will need a recent BUN and Creatine when you get a chance please put the order in thank you!

## 2020-03-06 NOTE — Telephone Encounter (Signed)
lvm for pt to call back just to see if she wants her MRI at [facility] . If she calls back please ask her if she wants to go to [facility].  Thank you!

## 2020-03-06 NOTE — Telephone Encounter (Signed)
Discussed with our [facility] representative and we are able to add to labs that were completed on 03/05/2020. We were able to get the [facility] test codes and she is calling to add them on now.

## 2020-03-07 ENCOUNTER — Telehealth: Payer: Self-pay | Admitting: Neurology

## 2020-03-07 LAB — SPECIMEN STATUS REPORT

## 2020-03-07 MED ORDER — ALPRAZOLAM 0.5 MG PO TABS: ORAL_TABLET | ORAL | 0 refills | Status: DC

## 2020-03-07 NOTE — Telephone Encounter (Signed)
Pt returned my call. I discussed the xanax RX and driving restrictions. Pt verbalized understanding of recommendations. Pt had no questions at this time but was encouraged to call back if questions arise.

## 2020-03-07 NOTE — Addendum Note (Signed)
Addended by: Star Age on: 03/07/2020 04:33 PM   Modules accepted: Orders

## 2020-03-07 NOTE — Telephone Encounter (Signed)
I called pt to discuss. No answer, left a message asking her to call me back. 

## 2020-03-07 NOTE — Telephone Encounter (Signed)
I have ordered Xanax for patient's upcoming MRI due to anxiety/claustrophobia reported. Please inform patient or caregiver and remind them, that she should not drive after taking Xanax and have someone take her for the MRI appointment.

## 2020-03-07 NOTE — Telephone Encounter (Signed)
Pt is asking if something can be called into [facility] to help relax her for the upcoming MRI in May, please call

## 2020-03-08 LAB — CREATININE, SERUM
Creatinine, Ser: 0.98 mg/dL (ref 0.57–1.00)
GFR calc Af Amer: 71 mL/min/{1.73_m2} (ref >59)
GFR calc non Af Amer: 61 mL/min/{1.73_m2} (ref >59)

## 2020-03-08 LAB — BUN: BUN: 14 mg/dL (ref 8–27)

## 2020-03-08 LAB — SPECIMEN STATUS REPORT

## 2020-03-11 ENCOUNTER — Telehealth: Payer: Self-pay

## 2020-03-11 LAB — B12 AND FOLATE PANEL
Folate: >20 ng/mL (ref >3.0)
Vitamin B-12: >2000 pg/mL — ABNORMAL HIGH (ref 232–1245)

## 2020-03-11 LAB — TSH: TSH: 2.53 u[IU]/mL (ref 0.450–4.500)

## 2020-03-11 LAB — VITAMIN B1: Thiamine: 139.5 nmol/L (ref 66.5–200.0)

## 2020-03-11 LAB — HGB A1C W/O EAG: Hgb A1c MFr Bld: 5.5 % (ref 4.8–5.6)

## 2020-03-11 LAB — ANA W/REFLEX: Anti Nuclear Antibody (ANA): NEGATIVE

## 2020-03-11 LAB — VITAMIN D 25 HYDROXY (VIT D DEFICIENCY, FRACTURES): Vit D, 25-Hydroxy: 33.3 ng/mL (ref 30.0–100.0)

## 2020-03-11 LAB — VITAMIN B6: Vitamin B6: 38.5 ug/L — ABNORMAL HIGH (ref 2.0–32.8)

## 2020-03-11 NOTE — Progress Notes (Signed)
Please call pt: Labs are fine with the exception of elevated vit B6 and B12; I would recommend she stop B vitamin supplements for now. She can have her B12 status rechecked in 3-6 months by her primary care.

## 2020-03-11 NOTE — Telephone Encounter (Signed)
I left a voicemail informing the patient that with her insurance they didn't have [facility] as a location just [facility] and that is how they had it set up with [facility] and I had to put the location down since she is having it through [facility].

## 2020-03-11 NOTE — Telephone Encounter (Signed)
-----   Message from Star Age, MD sent at 03/11/2020  7:16 AM EDT ----- Please call pt: Labs are fine with the exception of elevated vit B6 and B12; I would recommend she stop B vitamin supplements for now. She can have her B12 status rechecked in 3-6 months by her primary care.

## 2020-03-11 NOTE — Telephone Encounter (Signed)
I called pt to discuss. No answer, left a message asking her to call me back. 

## 2020-03-11 NOTE — Telephone Encounter (Signed)
Pt returned my call. I discussed her lab results and recommendations. I will send a copy of these labs to Dr. Willey Blade. Pt verbalized understanding of results.  Pt received a copy of her insurance authorization for her MRI and it says that she was approved for an MRI through [facility]. Pt is not sure what the "[facility] " means and wants to be sure that she can still have her MRI at [facility] next month. She is asking for a call from our MRI coordinator.

## 2020-03-11 NOTE — Telephone Encounter (Signed)
I called pt again. No answer, left a message asking her to call me back. °

## 2020-03-11 NOTE — Telephone Encounter (Signed)
Pt called back in regards to missed call  

## 2020-03-27 ENCOUNTER — Telehealth: Payer: Self-pay

## 2020-03-27 NOTE — Telephone Encounter (Signed)
LVM to schedule HST °

## 2020-04-02 ENCOUNTER — Other Ambulatory Visit: Payer: Self-pay

## 2020-04-02 ENCOUNTER — Ambulatory Visit (HOSPITAL_COMMUNITY)
Admission: RE | Admit: 2020-04-02 | Discharge: 2020-04-02 | Disposition: A | Discharge status: Home / Self Care | Payer: 59 | Source: Ambulatory Visit | Attending: Neurology | Admitting: Neurology

## 2020-04-02 DIAGNOSIS — R413 Other amnesia: Secondary | ICD-10-CM | POA: Insufficient documentation

## 2020-04-02 MED ORDER — GADOBUTROL 1 MMOL/ML IV SOLN
10.0000 mL | Freq: Once | INTRAVENOUS | Status: AC | PRN
  Administered 2020-04-02: 13:00:00 10 mL via INTRAVENOUS

## 2020-04-05 ENCOUNTER — Telehealth: Payer: Self-pay

## 2020-04-05 NOTE — Telephone Encounter (Signed)
Pt notified of report. She verbalized understanding and will keep sleep study as scheduled.

## 2020-04-05 NOTE — Telephone Encounter (Signed)
No strokes or other significant abnormality of the brain. She has some ischemic white matter changes that can be attributable to her hx of smoking and other risk factors such as obesity and high cholesterol and aging. I will defer to Dr. Dia Sitter whether she thinks these could be contributing to her memory changes but doesn't look that significant. thanks  Written by Melvenia Beam, MD on 04/04/2020 8:19 AM EDT

## 2020-04-05 NOTE — Telephone Encounter (Signed)
No acute findings on MRI brain and no obvious cause for memory issues, chronic vascular changes seen as mentioned by Dr. Jaynee Eagles.

## 2020-04-17 ENCOUNTER — Ambulatory Visit (INDEPENDENT_AMBULATORY_CARE_PROVIDER_SITE_OTHER): Payer: 59 | Admitting: Neurology

## 2020-04-17 DIAGNOSIS — R351 Nocturia: Secondary | ICD-10-CM

## 2020-04-17 DIAGNOSIS — G4733 Obstructive sleep apnea (adult) (pediatric): Secondary | ICD-10-CM | POA: Diagnosis not present

## 2020-04-17 DIAGNOSIS — G4719 Other hypersomnia: Secondary | ICD-10-CM

## 2020-04-17 DIAGNOSIS — E669 Obesity, unspecified: Secondary | ICD-10-CM

## 2020-04-17 DIAGNOSIS — R413 Other amnesia: Secondary | ICD-10-CM

## 2020-04-25 ENCOUNTER — Telehealth: Payer: Self-pay

## 2020-04-25 NOTE — Telephone Encounter (Signed)
I called pt. I advised pt that Dr. 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 Dr. Rexene Alberts recommends that pt start an autopap for treatment at home. I reviewed PAP compliance expectations with the pt. Pt is agreeable to starting an auto-PAP. I advised pt that an order will be sent to a DME, [facility], and [facility] will call the pt within about one week after they file with the pt's insurance. [facility] will show the pt how to use the machine, fit for masks, and troubleshoot the auto-PAP if needed. A follow up appt was made for insurance purposes with Dr. Rexene Alberts on 07/02/20 at 1 pm. Pt verbalized understanding to arrive 15 minutes early and bring their auto-PAP. A letter with all of this information in it will be mailed to the pt as a reminder. I verified with the pt that the address we have on file is correct. Pt verbalized understanding of results. Pt had no questions at this time but was encouraged to call back if questions arise. I have sent the order to [facility] and have received confirmation that they have received the order.

## 2020-04-25 NOTE — Telephone Encounter (Signed)
Pt returned call. Please call back when available. 

## 2020-04-25 NOTE — Procedures (Signed)
Patient Information     First Name: Sarah Last Name: [name] ID: [id number]  Birth Date: [birth year] Age: 64 Gender: Female  Referring Provider: Hale Bogus, MD BMI: 39.4 (W=251 lb, H=5' 7'')  Sleep Study Information    Study Date: Apr 17, 2020 S/H/A Version: 001.001.001.001 / 4.0.1515 / 69  History:    64 year old woman with a history of hyperlipidemia, reflux disease, prior smoking, and morbid obesity with a BMI of over 40, who reports forgetfulness and short-term memory issues.  Summary & Diagnosis:     OSA Recommendations:     This home sleep test demonstrates moderate obstructive sleep apnea with a total AHI of 16.5/hour and O2 nadir of 89%. Treatment with positive airway pressure is recommended and can be achieved in the form of an autoPAP titration/trial at home for now. Please note that untreated obstructive sleep apnea may carry additional perioperative morbidity. Patients with significant obstructive sleep apnea should receive perioperative PAP therapy and the surgeons and particularly the anesthesiologist should be informed of the diagnosis and the severity of the sleep disordered breathing. The patient should be cautioned not to drive, work at heights, or operate dangerous or heavy equipment when tired or sleepy. Review and reiteration of good sleep hygiene measures should be pursued with any patient. Other causes of the patient's symptoms, including circadian rhythm disturbances, an underlying mood disorder, medication effect and/or an underlying medical problem cannot be ruled out based on this test. Clinical correlation is recommended. The patient and her referring provider will be notified of the test results. The patient will be seen in follow up in sleep clinic at [facility].  I certify that I have reviewed the raw data recording prior to the issuance of this report in accordance with the standards of the American Academy of Sleep Medicine (AASM).  Star Age, MD, PhD [facility]) Diplomat, ABPN (Neurology and Sleep)                Sleep Summary  Oxygen Saturation Statistics   Start Study Time: End Study Time: Total Recording Time:          10:24:04 PM 6:36:40 AM   8 h, 12 min  Total Sleep Time % REM of Sleep Time:  6 h, 56 min  5.2    Mean: 93 Minimum: 89 Maximum: 98  Mean of Desaturations Nadirs (%):   91  Oxygen Desaturation. %:   4-9 10-20 >20 Total  Events Number Total    41  1 97.6 2.4  0 0.0  42 100.0  Oxygen Saturation: <90 <=88 <85 <80 <70  Duration (minutes): Sleep % 0.3 0.1  0.0 0.0  0.0 0.0 0.0 0.0 0.0 0.0     Respiratory Indices      Total Events REM NREM All Night  pRDI:  119  pAHI:  110 ODI:  42  pAHIc:  14  % CSR: 0.0 N/A N/A N/A N/A N/A N/A N/A N/A 17.9 16.5 6.3 2.2       Pulse Rate Statistics during Sleep (BPM)      Mean: 64 Minimum: 48 Maximum: 109    Indices are calculated using technically valid sleep time of 6 h, 39 min. Central-Indices are calculated using technically valid sleep time of 6 h, 20 min. pRDI/pAHI are calculated using oxi desaturations ? 3% REM/NREM indices appear only if REM time > 30 min.  Body Position Statistics  Position Supine Prone Right Left Non-Supine  Sleep (min) 156.0  0.0 0.1 260.5 260.6  Sleep % 37.4 0.0 0.0 62.5 62.6  pRDI 18.3 N/A N/A 17.6 17.6  pAHI 17.5 N/A N/A 16.0 16.0  ODI 6.3 N/A N/A 6.4 6.4     Snoring Statistics Snoring Level (dB) >40 >50 >60 >70 >80 >Threshold (45)  Sleep (min) 89.8 3.6 1.7 0.0 0.0 7.4  Sleep % 21.6 0.9 0.4 0.0 0.0 1.8    Mean: 40 dB Sleep Stages Chart                         pAHI=16.5                                     Mild              Moderate                    Severe                                                 5              15                    30

## 2020-04-25 NOTE — Telephone Encounter (Signed)
I called pt. No answer, left a message asking pt to call me back.   

## 2020-04-25 NOTE — Telephone Encounter (Signed)
-----   Message from Star Age, MD sent at 04/25/2020  7:54 AM EDT ----- Patient referred by Dr. Hale Bogus, seen by me on 03/05/20 for memory loss and confusion, HST on 04/17/20.    Please call and notify the patient that the recent home sleep test showed obstructive sleep apnea in the moderate range. I recommend treatment for this in the form of autoPAP, through a DME company (of her choice, or as per insurance requirement). The DME representative will educate her on how to use the machine, how to put the mask on, etc. I have placed an order in the chart. Please send referral, talk to patient, send report to referring MD. We will need a FU in sleep clinic for 10 weeks post-PAP set up, please arrange that with me or one of our NPs. Thanks,   Star Age, MD, PhD [facility])

## 2020-04-25 NOTE — Progress Notes (Signed)
Patient referred by Dr. Hale Bogus, seen by me on 03/05/20 for memory loss and confusion, HST on 04/17/20.    Please call and notify the patient that the recent home sleep test showed obstructive sleep apnea in the moderate range. I recommend treatment for this in the form of autoPAP, through a DME company (of her choice, or as per insurance requirement). The DME representative will educate her on how to use the machine, how to put the mask on, etc. I have placed an order in the chart. Please send referral, talk to patient, send report to referring MD. We will need a FU in sleep clinic for 10 weeks post-PAP set up, please arrange that with me or one of our NPs. Thanks,   Star Age, MD, PhD [facility])

## 2020-04-25 NOTE — Addendum Note (Signed)
Addended by: Star Age on: 04/25/2020 07:54 AM   Modules accepted: Orders

## 2020-05-16 ENCOUNTER — Ambulatory Visit: Payer: No Typology Code available for payment source | Admitting: Obstetrics & Gynecology

## 2020-07-02 ENCOUNTER — Ambulatory Visit: Payer: Self-pay | Admitting: Neurology

## 2020-07-30 ENCOUNTER — Ambulatory Visit (INDEPENDENT_AMBULATORY_CARE_PROVIDER_SITE_OTHER): Payer: 59 | Admitting: Neurology

## 2020-07-30 ENCOUNTER — Other Ambulatory Visit: Payer: Self-pay

## 2020-07-30 ENCOUNTER — Encounter: Payer: Self-pay | Admitting: Neurology

## 2020-07-30 VITALS — BP 128/82 | HR 80 | Ht 67.0 in | Wt 250.3 lb

## 2020-07-30 DIAGNOSIS — G4733 Obstructive sleep apnea (adult) (pediatric): Secondary | ICD-10-CM

## 2020-07-30 DIAGNOSIS — Z9989 Dependence on other enabling machines and devices: Secondary | ICD-10-CM

## 2020-07-30 DIAGNOSIS — Z818 Family history of other mental and behavioral disorders: Secondary | ICD-10-CM | POA: Diagnosis not present

## 2020-07-30 DIAGNOSIS — R413 Other amnesia: Secondary | ICD-10-CM | POA: Diagnosis not present

## 2020-07-30 NOTE — Progress Notes (Signed)
Subjective:    [name] is a 64 y.o. female.  HPI     Interim history:   Sarah [name] is a 64 year old right-handed woman with an underlying medical history of hyperlipidemia, reflux disease, prior smoking, and morbid obesity, who Presents for follow-up consultation on Sarah [name] memory loss and sleep apnea, after starting AutoPap therapy.  Sarah [name] is unaccompanied today.  I first met Sarah [name] on 03/05/2020 at Sarah request of Dr. Sabra Heck, [place], at which time Sarah [name] reported a several month history of forgetfulness.  Sarah [name] reported a family history of Alzheimer's dementia.  Sarah [name] was advised to proceed with evaluation of Sarah [name] memory loss with a brain MRI, additional laboratory testing and a sleep study.  Sarah [name] had a home sleep test on 04/17/2020 which showed moderate obstructive sleep apnea of a number of events with an AHI of 16.5/h, O2 nadir of 89%.  Sarah [name] was advised to start AutoPap therapy.  Sarah [name] set up date was 05/03/2020.  Laboratory testing from 03/05/2020 included vitamin D, B12, TSH, ANA, B6, B1, A1c.  Sarah [name] vitamin B12 was elevated above 2000, vitamin D was on Sarah lower side of Sarah spectrum at 33.3, vitamin B6 elevated at 38.5.  Sarah [name] was advised to stop Sarah [name] B vitamin supplementation and have Sarah [name] values rechecked by Sarah [name] primary care physician in 3 to 6 months.  Sarah [name] had a brain MRI with and without contrast on 04/02/2020 and I reviewed Sarah results: IMPRESSION: 1. No acute intracranial abnormality. 2. 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  Today, 07/30/2020: I reviewed Sarah [name] AutoPap compliance data from 05/03/2020 through 06/01/2020, which was Sarah first month, during which time Sarah [name] was very compliant with Sarah [name] AutoPap, average usage of 7 hours and 12 minutes, residual AHI 1.1, percent use days greater than 4 hours at 83%, in Sarah past 30 days, between 06/25/2020 through 07/24/2020 Sarah [name] used Sarah [name] machine only 12 days with percent use days greater than 4 hours  at 7% only.  Sarah [name] reports that Sarah [name] is struggling with it lately, Sarah [name] has skipped a few nights.  Sarah [name] was not sure if Sarah settings were right.  Sarah [name] has not felt any differently, memory wise Sarah [name] feels stable.  Sarah [name] has been making a bigger effort and exercising.  Sarah [name] has been walking outside, has been walking 2 dogs in Sarah neighborhood as a dog walker.  Sarah [name] enjoys this activity.  Sarah [name] cannot have Sarah [name] own dogs because of lack of a fence.  Sarah [name] has lost a little bit of weight.  Sarah [name] tries to read before falling asleep but has had some trouble staying asleep.  Melatonin even up to 40 mg does not seem to help.  Sarah [name] takes a 20 mg pill, 2 at night.  Sarah [name] is advised that a higher dose of melatonin has not been proven to be beneficial.  Sarah [name] has not had any recent medication changes.  Sarah [name] is motivated to continue with Sarah [name] AutoPap.  Sarah [name] had some difficulty with Sarah [name] interface in Sarah beginning.  Sarah [name] used a nasal cushion or cradle interface in Sarah beginning but now is using nasal pillows which Sarah [name] prefers.  Sarah [name] leak information also suggests a better seal with Sarah nasal pillows lately.  Sarah [name]'s allergies, current medications, family history, past medical history, past social history, past surgical history and problem list were reviewed and updated as appropriate.   Previously:   03/05/20: (Sarah [name]) reports forgetfulness and short-term memory issues for Sarah past 6 months, Sarah [name]  had confusion and got lost driving 2 times. Sarah [name] has no underlying mood d/o, or confusion at home, no personality changes, no hallucinations. Sarah [name] Father had Alzheimers and lived to be 61 yo, mom had a gait and balance d/o, both GMs had dementia. [name] has 2 older sisters and 2 younger brothers, none with memory issues. I reviewed your office note from 01/29/2020. Sarah [name] had blood work through Sarah [name] primary care physician's office on 12/06/2019 and brought a copy for my review, CMP was unremarkable, creatinine was 1.09 and BUN 15, borderline GFR at 54, slightly  reduced.  Urinalysis was negative, CBC showed WBC of 6.2, normal platelets, lipid panel showed total cholesterol of 230, triglycerides 197, LDL 155, uric acid was mildly elevated at 7.8.  Sarah [name] reports some daytime somnolence, Epworth sleepiness score is 9 out of 24.  Some years ago Sarah [name] had a sleep study because Sarah [name] was sleepy at Sarah wheel.  Sarah [name] was not diagnosed with obstructive sleep apnea and has never been on CPAP therapy.  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  Sarah [name] lives with Sarah [name] husband, they have no children, Sarah [name] quit smoking in 2005, Sarah [name] drinks alcohol very rarely, and caffeine in Sarah form of coffee, 2 cups/day.  Sarah [name] tries to hydrate well with water.  Sarah [name] had an eye examination in February and has prescription eyeglasses.  Sarah [name] has nocturia about once per average night, bedtime is typically late, around 1 AM and rise time between 7 and 9.  Sarah [name] has never had a brain MRI.  Sarah [name] does not exercise very much but is planning to increase Sarah [name] physical activity.  Sarah [name] Past Medical History Is Significant For: Past Medical History:  Diagnosis Date  . Family history of anesthesia complication    father hard to wake up  . GERD (gastroesophageal reflux disease)   . Hypercholesteremia   . Medical history non-contributory   . Wears glasses     Sarah [name] Past Surgical History Is Significant For: Past Surgical History:  Procedure Laterality Date  . COLONOSCOPY    . FINGER SURGERY     removal of bump on finger, benign  . MOUTH SURGERY     remove of bone and tooth    Sarah [name] Family History Is Significant For: Family History  Problem Relation Age of Onset  . Cancer Mother        skin  . Cancer Father        skin  . Stroke Maternal Grandmother   . Stroke Paternal Grandmother     Sarah [name] Social History Is Significant For: Social History   Socioeconomic History  . Marital status: Married    Spouse name: Not on file  . Number of children: Not on file  . Years of education: Not  on file  . Highest education level: Not on file  Occupational History  . Not on file  Tobacco Use  . Smoking status: Former Smoker    Quit date: 06/06/1997    Years since quitting: 23.1  . Smokeless tobacco: Never Used  Vaping Use  . Vaping Use: Never used  Substance and Sexual Activity  . Alcohol use: Yes    Alcohol/week: 1.0 standard drink    Types: 1 Cans of beer per week  . Drug use: No  . Sexual activity: Not Currently    Partners: Male  Other Topics Concern  . Not on file  Social History Narrative  . Not on file   Social Determinants of Health  Financial Resource Strain:   . Difficulty of Paying Living Expenses: Not on file  Food Insecurity:   . Worried About Charity fundraiser in Sarah Last Year: Not on file  . Ran Out of Food in Sarah Last Year: Not on file  Transportation Needs:   . Lack of Transportation (Medical): Not on file  . Lack of Transportation (Non-Medical): Not on file  Physical Activity:   . Days of Exercise per Week: Not on file  . Minutes of Exercise per Session: Not on file  Stress:   . Feeling of Stress : Not on file  Social Connections:   . Frequency of Communication with Friends and Family: Not on file  . Frequency of Social Gatherings with Friends and Family: Not on file  . Attends Religious Services: Not on file  . Active Member of Clubs or Organizations: Not on file  . Attends Archivist Meetings: Not on file  . Marital Status: Not on file    Sarah [name] Allergies Are:  Allergies  Allergen Reactions  . Codeine Itching  :   Sarah [name] Current Medications Are:  Outpatient Encounter Medications as of 07/30/2020  Medication Sig  . ALPRAZolam (XANAX) 0.5 MG tablet Take 1-2 pills as needed on call to MRI, may take a third pill if needed.  . cholecalciferol (VITAMIN D3) 25 MCG (1000 UNIT) tablet Take 1,000 Units by mouth daily.  . Multiple Vitamin (MULTIVITAMIN) tablet Take 1 tablet by mouth daily.  . phentermine 15 MG capsule Take 15 mg by  mouth daily.   No facility-administered encounter medications on file as of 07/30/2020.  :  Review of Systems:  Out of a complete 14 point review of systems, all are reviewed and negative with Sarah exception of these symptoms as listed below: Review of Systems  Neurological:       Here for f/u on starting auto pap. Reports things are going ok, feels like Sarah [name] settings are still off.     Objective:  Neurological Exam  Physical Exam Physical Examination:   Vitals:   07/30/20 1301  BP: 128/82  Pulse: 80  SpO2: 98%    General Examination: Sarah [name] is a very pleasant 65 y.o. female in no acute distress. Sarah [name] appears well-developed and well-nourished and well groomed.   HEENT: Normocephalic, atraumatic, pupils are equal, round and reactive to light, tracking is well preserved, hearing grossly intact. Face is symmetric with normal facial animation. Speech is clear with no dysarthria noted. There is no hypophonia. There is no lip, neck/head, jaw or voice tremor. Neck is supple with full range of passive and active motion. Oropharynx exam reveals: mild mouth dryness, good dental hygiene and moderate airway crowding.  Tongue protrudes centrally in palate elevates symmetrically.  Chest: Clear to auscultation without wheezing, rhonchi or crackles noted.  Heart: S1+S2+0, regular and normal without murmurs, rubs or gallops noted.   Abdomen: Soft, non-tender and non-distended with normal bowel sounds appreciated on auscultation.  Extremities: There is trace edema in Sarah distal lower extremities bilaterally.   Skin: Warm and dry without trophic changes noted.  Musculoskeletal: exam reveals no obvious joint deformities, tenderness or joint swelling or erythema.   Neurologically:  Mental status: Sarah [name] is awake, alert and oriented in all 4 spheres. Sarah [name] immediate and remote memory, attention, language skills and fund of knowledge are appropriate. There is no evidence of aphasia,  agnosia, apraxia or anomia. Speech is clear with normal prosody and enunciation. Thought process is linear. Mood is  normal and affect is normal.   (On 03/05/2020: MOCA 26/30 (Sarah [name] lost 1 point on orientation, 2 points on serial sevens, one-point on remote recall), CDT 4/4, AFT: 13/min.)  Cranial nerves II - XII are as described above under HEENT exam. Motor exam: Normal bulk, strength and tone is noted. There is no tremor. Fine motor skills and coordination: Grossly intact.    Cerebellar testing: No dysmetria or intention tremor. There is no truncal or gait ataxia.  Sensory exam: intact to light touch in Sarah upper and lower extremities.  Gait, station and balance: Sarah [name] stands easily. No veering to one side is noted. No leaning to one side is noted. Posture is age-appropriate and stance is narrow based. Gait shows normal stride length and normal pace. No problems turning are noted.               Assessment and Plan:   In summary, MEDRITH VEILLON is a very pleasant 64 year old female with an underlying medical history of hyperlipidemia, reflux disease, prior smoking, and obesity, who presents for  follow-up consultation of Sarah [name] memory loss.  Sarah [name] had a recent brain MRI which did not show any significant abnormalities, chronic changes were seen.  Sarah [name] reports a family history of Alzheimer's dementia affecting Sarah [name] father and also both grandmothers had dementia of unclear etiology.  Sarah [name] has recently undergone a home sleep test which showed moderate obstructive sleep apnea by a number of events, by oxygen criteria Sarah [name] was in Sarah mild range.  Sarah [name] has established treatment at home with AutoPap therapy and was excellent with Sarah [name] compliance and first month and has had some lapses in treatment lately.  Sarah [name] is still struggling with tolerance of Sarah treatment.  We did some blood work earlier this year as well.  We will continue to monitor Sarah [name] memory.  In addition, I would like to proceed with a more formal and in-depth  evaluation of Sarah [name] memory with Sarah help of a neuropsychologist.  Sarah [name] is agreeable to this referral, I explained this to Sarah [name].  Sarah [name] is motivated to continue with Sarah [name] AutoPap and is commended for Sarah [name] lifestyle changes.  Sarah [name] is working on exercise and weight loss and has lost some weight.  Sarah [name] is walking 2 dogs on a daily basis throughout Sarah week while their owner is at work.  Sarah [name] enjoys this activity.  Sarah [name] also likes to read.  Sarah [name] is advised to follow-up routinely in 6 months, and continue with Sarah [name] AutoPap therapy and we will also be on Sarah look out for an appointment with neuropsychology in Sarah interim.  We will not start any new medications from my end of things.  I answered all Sarah [name] questions today and Sarah [name] was in agreement with Sarah plan.   I spent 30 minutes in total face-to-face time and in reviewing records during pre-charting, more than 50% of which was spent in counseling and coordination of care, reviewing test results, reviewing medications and treatment regimen and/or in discussing or reviewing Sarah diagnosis of memory loss, OSA, Sarah prognosis and treatment options. Pertinent laboratory and imaging test results that were available during this visit with Sarah [name] were reviewed by me and considered in my medical decision making (see chart for details).

## 2020-07-30 NOTE — Patient Instructions (Signed)
Please continue using your autoPAP regularly. While your insurance requires that you use PAP at least 4 hours each night on 70% of the nights, I recommend, that you not skip any nights and use it throughout the night if you can. Getting used to PAP and staying with the treatment long term does take time and patience and discipline. Untreated obstructive sleep apnea when it is moderate to severe can have an adverse impact on cardiovascular health and raise her risk for heart disease, arrhythmias, hypertension, congestive heart failure, stroke and diabetes. Untreated obstructive sleep apnea causes sleep disruption, nonrestorative sleep, and sleep deprivation. This can have an impact on your day to day functioning and cause daytime sleepiness and impairment of cognitive function, memory loss, mood disturbance, and problems focussing. Using PAP regularly can improve these symptoms.  We will request a formal cognitive test called neuropsychological evaluation which is done by a licensed neuropsychologist. We will make a referral in that regard and their office will call to schedule you for an appointment. Please be reminded, that this is a lengthy appointment.    You can continue to use Melatonin at night for sleep: take up to 10 mg if needed, 1-2 hours before bedtime.  I usually do not recommend a dose above 10 mg.  You can also look up an over-the-counter supplement for sleep called More Sleep by the [place].

## 2020-08-01 ENCOUNTER — Encounter: Payer: 59 | Admitting: Counselor

## 2020-08-06 ENCOUNTER — Encounter: Payer: 59 | Admitting: Counselor

## 2021-01-27 ENCOUNTER — Telehealth: Payer: Self-pay

## 2021-01-27 ENCOUNTER — Ambulatory Visit: Payer: Self-pay | Admitting: Neurology

## 2021-01-27 ENCOUNTER — Ambulatory Visit: Payer: 59 | Admitting: Neurology

## 2021-01-27 NOTE — Telephone Encounter (Signed)
Pt was on the schedule for 01/27/21 at 1 pm. The res med DL showed last date of use in OCT of 2021, pt also c/a appt scheduled with Dr. Nicole Kindred ( neuro psych). I called the pt and we discussed. Pt reports she could not tolerate the auto pap and could not proceed with neuro psych due to cost.  Pt sts she would like to c/a appt today and she would call back for f/u. I advised we could keep this appt today to discuss other options but pt declined.  Pt sts she will call back in a month if she would like to follow up.

## 2021-04-25 ENCOUNTER — Ambulatory Visit: Payer: 59

## 2021-09-08 ENCOUNTER — Encounter: Payer: 59 | Admitting: Obstetrics & Gynecology

## 2021-09-10 ENCOUNTER — Other Ambulatory Visit: Payer: PRIVATE HEALTH INSURANCE | Admitting: Obstetrics & Gynecology

## 2021-10-07 ENCOUNTER — Ambulatory Visit: Payer: Medicare HMO | Admitting: Orthopedic Surgery

## 2021-10-20 ENCOUNTER — Ambulatory Visit (INDEPENDENT_AMBULATORY_CARE_PROVIDER_SITE_OTHER): Payer: Medicare HMO | Admitting: Obstetrics & Gynecology

## 2021-10-20 ENCOUNTER — Encounter: Payer: Self-pay | Admitting: Obstetrics & Gynecology

## 2021-10-20 ENCOUNTER — Other Ambulatory Visit: Payer: Self-pay

## 2021-10-20 VITALS — BP 128/76 | HR 91 | Ht 66.0 in | Wt 250.8 lb

## 2021-10-20 DIAGNOSIS — Z01419 Encounter for gynecological examination (general) (routine) without abnormal findings: Secondary | ICD-10-CM | POA: Diagnosis not present

## 2021-10-20 NOTE — Progress Notes (Signed)
   WELL-WOMAN EXAMINATION Patient name: Sarah [name] MRN [id number]  Date of birth: Jun 08, 1956 Chief Complaint:   Gynecologic Exam  History of Present Illness:   Sarah [name] is a 65 y.o. G0P0000 PM female being seen today for a routine well-woman exam.  Today she notes: no acute complaints or concerns  Reading on her kindle- fav Pryor Curia- Laretta Bolster who unfortunately just passed away   Patient's last menstrual period was 05/23/2008.  Last pap 01/2020.  Last mammogram: 06/2020. Last colonoscopy: 2021  Depression screen PHQ 2/9 10/20/2021  Decreased Interest 1  Down, Depressed, Hopeless 1  PHQ - 2 Score 2  Altered sleeping 1  Tired, decreased energy 0  Change in appetite 0  Feeling bad or failure about yourself  1  Trouble concentrating 0  Moving slowly or fidgety/restless 0  Suicidal thoughts 0  PHQ-9 Score 4      Review of Systems:   Pertinent items are noted in HPI Denies any headaches, blurred vision, fatigue, shortness of breath, chest pain, abdominal pain, bowel movements, urination, or intercourse unless otherwise stated above.  Pertinent History Reviewed:  Reviewed past medical,surgical, social and family history.  Reviewed problem list, medications and allergies. Physical Assessment:   Vitals:   10/20/21 1432  BP: 128/76  Pulse: 91  Weight: 250 lb 12.8 oz (113.8 kg)  Height: 5\' 6"  (1.676 m)  Body mass index is 40.48 kg/m.        Physical Examination:   General appearance - well appearing, and in no distress  Mental status - alert, oriented to person, place, and time  Psych:  She has a normal mood and affect  Skin - warm and dry, normal color, no suspicious lesions noted  Chest - effort normal, all lung fields clear to auscultation bilaterally  Heart - normal rate and regular rhythm  Neck:  midline trachea, no thyromegaly or nodules  Breasts - breasts appear normal, no suspicious masses, no skin or nipple changes or  axillary nodes  Abdomen - obese,  soft, nontender, nondistended, no masses or organomegaly  Pelvic - VULVA: normal appearing vulva with no masses, tenderness or lesions  VAGINA: normal appearing vagina with normal color and discharge, no lesions  CERVIX: normal appearing cervix without discharge or lesions, no CMT  Pap not indicated  UTERUS: uterus is felt to be normal size, shape, consistency and nontender   ADNEXA: No adnexal masses or tenderness noted.  Extremities:  No swelling or varicosities noted  Chaperone: Celene Squibb     Assessment & Plan:  1) Well-Woman Exam -screening exams up to date, reviewed guidelines  No orders of the defined types were placed in this encounter.   Meds: No orders of the defined types were placed in this encounter.   Follow-up: Return in about 1 year (around 10/20/2022), or if symptoms worsen or fail to improve, for Annual.   Janyth Pupa, DO Attending [place], [place] for [facility], [place]

## 2021-12-29 DIAGNOSIS — G4733 Obstructive sleep apnea (adult) (pediatric): Secondary | ICD-10-CM | POA: Diagnosis not present

## 2021-12-29 DIAGNOSIS — E785 Hyperlipidemia, unspecified: Secondary | ICD-10-CM | POA: Diagnosis not present

## 2021-12-29 DIAGNOSIS — M109 Gout, unspecified: Secondary | ICD-10-CM | POA: Diagnosis not present

## 2021-12-29 DIAGNOSIS — Z79899 Other long term (current) drug therapy: Secondary | ICD-10-CM | POA: Diagnosis not present

## 2022-01-06 DIAGNOSIS — N1831 Chronic kidney disease, stage 3a: Secondary | ICD-10-CM | POA: Diagnosis not present

## 2022-01-06 DIAGNOSIS — E785 Hyperlipidemia, unspecified: Secondary | ICD-10-CM | POA: Diagnosis not present

## 2022-01-06 DIAGNOSIS — M109 Gout, unspecified: Secondary | ICD-10-CM | POA: Diagnosis not present

## 2022-01-06 DIAGNOSIS — U099 Post covid-19 condition, unspecified: Secondary | ICD-10-CM | POA: Diagnosis not present

## 2022-02-12 DIAGNOSIS — E785 Hyperlipidemia, unspecified: Secondary | ICD-10-CM | POA: Diagnosis not present

## 2022-02-12 DIAGNOSIS — Z79899 Other long term (current) drug therapy: Secondary | ICD-10-CM | POA: Diagnosis not present

## 2022-02-19 DIAGNOSIS — E785 Hyperlipidemia, unspecified: Secondary | ICD-10-CM | POA: Diagnosis not present

## 2022-02-19 DIAGNOSIS — G4733 Obstructive sleep apnea (adult) (pediatric): Secondary | ICD-10-CM | POA: Diagnosis not present

## 2022-02-24 DIAGNOSIS — M1811 Unilateral primary osteoarthritis of first carpometacarpal joint, right hand: Secondary | ICD-10-CM | POA: Diagnosis not present

## 2022-03-03 DIAGNOSIS — M25541 Pain in joints of right hand: Secondary | ICD-10-CM | POA: Diagnosis not present

## 2022-03-03 DIAGNOSIS — M1811 Unilateral primary osteoarthritis of first carpometacarpal joint, right hand: Secondary | ICD-10-CM | POA: Diagnosis not present

## 2022-03-20 ENCOUNTER — Ambulatory Visit: Payer: Medicare HMO | Admitting: Pulmonary Disease

## 2022-03-20 ENCOUNTER — Encounter: Payer: Self-pay | Admitting: Pulmonary Disease

## 2022-03-20 VITALS — BP 128/66 | HR 74 | Temp 98.0°F | Ht 66.0 in | Wt 246.8 lb

## 2022-03-20 DIAGNOSIS — G4733 Obstructive sleep apnea (adult) (pediatric): Secondary | ICD-10-CM | POA: Diagnosis not present

## 2022-03-20 NOTE — Patient Instructions (Signed)
Will arrange for home sleep study Will call to arrange for follow up after sleep study reviewed  

## 2022-03-20 NOTE — Progress Notes (Signed)
? ? Pulmonary, Critical Care, and Sleep Medicine ? ?Chief Complaint  ?Patient presents with  ? Consult  ?  Ref for potential Osa. Has trouble staying asleep   ? ? ?Past Surgical History:  ?She  has a past surgical history that includes Colonoscopy; Finger surgery; and Mouth surgery. ? ?Past Medical History:  ?GERD, HLD ? ?Constitutional:  ?BP 128/66 (BP Location: Left Wrist, Patient Position: Sitting)   Pulse 74   Temp 98 ?F (36.7 ?C) (Temporal)   Ht '5\' 6"'$  (1.676 m)   Wt 246 lb 12.8 oz (111.9 kg)   LMP 05/23/2008   SpO2 99% Comment: ra  BMI 39.83 kg/m?  ? ?Brief Summary:  ?Sarah [name] is a 66 y.o. female with obstructive sleep apnea. ?  ? ? ? ?Subjective:  ? ?She was seen by Dr. Rexene Alberts with neurology in 2021 for memory issues.  She had a home sleep study that showed moderate sleep apnea.  She bought a CPAP from [place].  She had trouble adjusting to the mask and pressure.  She hasn't used CPAP for a couple of years. ? ?She has trouble staying asleep and feeling sleepy during the day.  She naps after breakfast, and would nap later in the day if her scheduled allowed this.  She snores and wakes up hearing herself snore.  She has trouble sleeping on her back.  She is a restless sleeper. ? ?She goes to sleep between 10 pm and 12 am.  She falls asleep in 30 minutes.  She wakes up some times to use the bathroom.  She gets out of bed at 730 am.  She feels tired in the morning.  She denies morning headache.  She does not use anything to help her  stay awake.  She takes melatonin at 10 pm.  She wakes up once around 4 am - this sometimes is from her cat jumping into bed.   ? ?She denies sleep walking, sleep talking, bruxism, or nightmares.  There is no history of restless legs.  She denies sleep hallucinations, sleep paralysis, or cataplexy. ? ?The Epworth score is 5 out of 24. ? ? ?Physical Exam:  ? ?Appearance - well kempt  ? ?ENMT - no sinus tenderness, no oral exudate, no LAN, Mallampati 3  airway, no stridor, over bite ? ?Respiratory - equal breath sounds bilaterally, no wheezing or rales ? ?CV - s1s2 regular rate and rhythm, no murmurs ? ?Ext - no clubbing, no edema ? ?Skin - no rashes ? ?Psych - normal mood and affect ?  ?Sleep Tests:  ?HST 04/17/20 >> AHI 16.5, SpO2 low 89% ? ?Social History:  ?She  reports that she quit smoking about 24 years ago. Her smoking use included cigarettes. She has never used smokeless tobacco. She reports current alcohol use of about 1.0 standard drink per week. She reports that she does not use drugs. ? ?Family History:  ?Her family history includes Cancer in her father and mother; Stroke in her maternal grandmother and paternal grandmother. ?  ? ?Discussion:  ?She has history of obstructive sleep apnea but had trouble with previous trial of CPAP.  She continues to have snoring, sleep disruption, apnea and daytime sleepiness.  I am concerned she still has significant sleep apnea.  If she still has sleep apnea, then we could try CPAP again (she purchased her own machine) or arrange for an oral appliance. ? ?Assessment/Plan:  ? ?Snoring with excessive daytime sleepiness. ?- will need to arrange for a  home sleep study ? ?Obesity. ?- discussed how weight can impact sleep and risk for sleep disordered breathing ?- discussed options to assist with weight loss: combination of diet modification, cardiovascular and strength training exercises ? ?Cardiovascular risk. ?- had an extensive discussion regarding the adverse health consequences related to untreated sleep disordered breathing ?- specifically discussed the risks for hypertension, coronary artery disease, cardiac dysrhythmias, cerebrovascular disease, and diabetes ?- lifestyle modification discussed ? ?Safe driving practices. ?- discussed how sleep disruption can increase risk of accidents, particularly when driving ?- safe driving practices were discussed ? ?Therapies for obstructive sleep apnea. ?- if the sleep study  shows significant sleep apnea, then various therapies for treatment were reviewed: CPAP, oral appliance, and surgical interventions ? ?Time Spent Involved in Patient Care on Day of Examination:  ?38 minutes ? ?Follow up:  ? ?Patient Instructions  ?Will arrange for home sleep study ?Will call to arrange for follow up after sleep study reviewed ? ? ?Medication List:  ? ?Allergies as of 03/20/2022   ? ?   Reactions  ? Codeine Itching  ? ?  ? ?  ?Medication List  ?  ? ?  ? Accurate as of March 20, 2022 10:19 AM. If you have any questions, ask your nurse or doctor.  ?  ?  ? ?  ? ?STOP taking these medications   ? ?ALPRAZolam 0.5 MG tablet ?Commonly known as: Duanne Moron ?Stopped by: Chesley Mires, MD ?  ? ?  ? ?TAKE these medications   ? ?cholecalciferol 25 MCG (1000 UNIT) tablet ?Commonly known as: VITAMIN D3 ?Take 1,000 Units by mouth daily. ?  ?multivitamin tablet ?Take 1 tablet by mouth daily. ?  ?phentermine 15 MG capsule ?Take 15 mg by mouth daily. ?  ?rosuvastatin 20 MG tablet ?Commonly known as: CRESTOR ?Take 20 mg by mouth at bedtime. ?  ? ?  ? ? ?Signature:  ?Chesley Mires, MD ?[place] ?Pager - [phone] - [phone] ?03/20/2022, 10:19 AM ?  ? ? ? ? ? ? ? ? ?

## 2022-03-24 DIAGNOSIS — L308 Other specified dermatitis: Secondary | ICD-10-CM | POA: Diagnosis not present

## 2022-05-19 ENCOUNTER — Ambulatory Visit: Payer: Medicare HMO

## 2022-05-19 DIAGNOSIS — G4733 Obstructive sleep apnea (adult) (pediatric): Secondary | ICD-10-CM | POA: Diagnosis not present

## 2022-05-21 ENCOUNTER — Telehealth: Payer: Self-pay | Admitting: Pulmonary Disease

## 2022-05-21 DIAGNOSIS — G4733 Obstructive sleep apnea (adult) (pediatric): Secondary | ICD-10-CM

## 2022-05-21 NOTE — Telephone Encounter (Signed)
HST 05/19/22 >> AHI 11.1, SpO2 low 85%  Please inform her that her sleep study shows mild obstructive sleep apnea.  Please arrange for ROV with me or NP to discuss treatment options.

## 2022-05-21 NOTE — Telephone Encounter (Signed)
Spoke with patient regarding hst results. They verbalized understanding. No further questions.  Nothing further needed at this time.

## 2022-06-02 ENCOUNTER — Other Ambulatory Visit (HOSPITAL_COMMUNITY): Payer: Self-pay | Admitting: Internal Medicine

## 2022-06-02 ENCOUNTER — Encounter: Payer: Self-pay | Admitting: Pulmonary Disease

## 2022-06-02 ENCOUNTER — Ambulatory Visit: Payer: Medicare HMO | Admitting: Pulmonary Disease

## 2022-06-02 VITALS — BP 128/70 | HR 74 | Temp 97.6°F | Ht 66.0 in | Wt 239.8 lb

## 2022-06-02 DIAGNOSIS — G4733 Obstructive sleep apnea (adult) (pediatric): Secondary | ICD-10-CM | POA: Diagnosis not present

## 2022-06-02 DIAGNOSIS — Z7189 Other specified counseling: Secondary | ICD-10-CM

## 2022-06-02 DIAGNOSIS — Z1231 Encounter for screening mammogram for malignant neoplasm of breast: Secondary | ICD-10-CM

## 2022-06-02 NOTE — Patient Instructions (Signed)
Will have Sarah [name] arrange for replacement CPAP mask and supplies, and set your auto CPAP to 5 - 15 cm water pressure  Follow up in 2 months

## 2022-06-02 NOTE — Progress Notes (Signed)
[facility] Pulmonary, Critical Care, and Sleep Medicine  Chief Complaint  Patient presents with   Follow-up    Appt to discuss osa treatment options     Past Surgical History:  She  has a past surgical history that includes Colonoscopy; Finger surgery; and Mouth surgery.  Past Medical History:  GERD, HLD  Constitutional:  BP 128/70 (BP Location: Left Arm, Patient Position: Sitting)   Pulse 74   Temp 97.6 F (36.4 C) (Temporal)   Ht '5\' 6"'$  (1.676 m)   Wt 239 lb 12.8 oz (108.8 kg)   LMP 05/23/2008   SpO2 99% Comment: ra  BMI 38.70 kg/m   Brief Summary:  Sarah [name] is a 66 y.o. female with obstructive sleep apnea.      Subjective:   Her homer sleep study showed mild sleep apnea.  She still has difficulty with her sleep.   Physical Exam:   Appearance - well kempt   ENMT - no sinus tenderness, no oral exudate, no LAN, Mallampati 3 airway, no stridor, over bite  Respiratory - equal breath sounds bilaterally, no wheezing or rales  CV - s1s2 regular rate and rhythm, no murmurs  Ext - no clubbing, no edema  Skin - no rashes  Psych - normal mood and affect   Sleep Tests:  HST 04/17/20 >> AHI 16.5, SpO2 low 89% HST 05/19/22 >> AHI 11.1, SpO2 low 85%  Social History:  She  reports that she quit smoking about 25 years ago. Her smoking use included cigarettes. She has never used smokeless tobacco. She reports current alcohol use of about 1.0 standard drink of alcohol per week. She reports that she does not use drugs.  Family History:  Her family history includes Cancer in her father and mother; Stroke in her maternal grandmother and paternal grandmother.     Assessment/Plan:   Obstructive sleep apnea. - sleep study reviewed - discussed how sleep apnea can impact her sleep quality, daytime function and health - treatment options reviewed - she would like to try CPAP again; she has a [employer] auto CPAP that she purchased in 2021 - will get her re-established  with [facility] for her DME - will arrange for replacement CPAP mask and supplies - will have her auto CPAP at 5 to 15 cm H2O  Obesity. - discussed how weight can impact sleep and risk for sleep disordered breathing - discussed options to assist with weight loss: combination of diet modification, cardiovascular and strength training exercises  Time Spent Involved in Patient Care on Day of Examination:  26 minutes  Follow up:   Patient Instructions  Will have [place] arrange for replacement CPAP mask and supplies, and set your auto CPAP to 5 - 15 cm water pressure  Follow up in 2 months  Medication List:   Allergies as of 06/02/2022       Reactions   Codeine Itching        Medication List        Accurate as of June 02, 2022  8:49 AM. If you have any questions, ask your nurse or doctor.          STOP taking these medications    cholecalciferol 25 MCG (1000 UNIT) tablet Commonly known as: VITAMIN D3 Stopped by: Chesley Mires, MD   phentermine 15 MG capsule Stopped by: Chesley Mires, MD       TAKE these medications    multivitamin tablet Take 1 tablet by mouth daily.   rosuvastatin  20 MG tablet Commonly known as: CRESTOR Take 20 mg by mouth at bedtime.        Signature:  Chesley Mires, MD [place] Pager - [phone] 06/02/2022, 8:49 AM

## 2022-06-03 ENCOUNTER — Telehealth: Payer: Self-pay | Admitting: Pulmonary Disease

## 2022-06-03 ENCOUNTER — Other Ambulatory Visit: Payer: Self-pay

## 2022-06-03 DIAGNOSIS — G4733 Obstructive sleep apnea (adult) (pediatric): Secondary | ICD-10-CM

## 2022-06-03 NOTE — Telephone Encounter (Signed)
Called and spoke to patient. She states [place] does not accept her insurance. She asked that all orders and sleep study for CP[facility] be sent to [facility]. New order placed. And ov notes/order faxed to [phone]

## 2022-06-10 ENCOUNTER — Inpatient Hospital Stay
Admission: RE | Admit: 2022-06-10 | Discharge: 2022-06-10 | Disposition: A | Discharge status: Home / Self Care | Payer: Self-pay | Source: Ambulatory Visit | Attending: Internal Medicine | Admitting: Internal Medicine

## 2022-06-10 ENCOUNTER — Ambulatory Visit (HOSPITAL_COMMUNITY)
Admission: RE | Admit: 2022-06-10 | Discharge: 2022-06-10 | Disposition: A | Discharge status: Home / Self Care | Payer: Medicare HMO | Source: Ambulatory Visit | Attending: Internal Medicine | Admitting: Internal Medicine

## 2022-06-10 ENCOUNTER — Other Ambulatory Visit (HOSPITAL_COMMUNITY): Payer: Self-pay | Admitting: Internal Medicine

## 2022-06-10 DIAGNOSIS — Z1231 Encounter for screening mammogram for malignant neoplasm of breast: Secondary | ICD-10-CM

## 2022-06-10 DIAGNOSIS — Z780 Asymptomatic menopausal state: Secondary | ICD-10-CM

## 2022-06-15 ENCOUNTER — Ambulatory Visit (HOSPITAL_COMMUNITY)
Admission: RE | Admit: 2022-06-15 | Discharge: 2022-06-15 | Disposition: A | Discharge status: Home / Self Care | Payer: Medicare HMO | Source: Ambulatory Visit | Attending: Internal Medicine | Admitting: Internal Medicine

## 2022-06-15 DIAGNOSIS — M85851 Other specified disorders of bone density and structure, right thigh: Secondary | ICD-10-CM | POA: Diagnosis not present

## 2022-06-15 DIAGNOSIS — Z780 Asymptomatic menopausal state: Secondary | ICD-10-CM | POA: Insufficient documentation

## 2022-07-28 ENCOUNTER — Ambulatory Visit: Payer: Medicare HMO | Admitting: Pulmonary Disease

## 2022-07-29 ENCOUNTER — Encounter: Payer: Self-pay | Admitting: Pulmonary Disease

## 2022-07-29 ENCOUNTER — Ambulatory Visit: Payer: Medicare HMO | Admitting: Pulmonary Disease

## 2022-07-29 VITALS — BP 132/80 | HR 78 | Temp 97.7°F | Ht 66.0 in | Wt 232.8 lb

## 2022-07-29 DIAGNOSIS — R682 Dry mouth, unspecified: Secondary | ICD-10-CM | POA: Diagnosis not present

## 2022-07-29 DIAGNOSIS — G4733 Obstructive sleep apnea (adult) (pediatric): Secondary | ICD-10-CM

## 2022-07-29 NOTE — Progress Notes (Signed)
[place] Pulmonary, Critical Care, and Sleep Medicine  Chief Complaint  Patient presents with   Follow-up    Breathing doing well.  Brought CPAP but no card in machine. Has dry mouth after using cpap.     Past Surgical History:  She  has a past surgical history that includes Colonoscopy; Finger surgery; and Mouth surgery.  Past Medical History:  GERD, HLD  Constitutional:  BP 132/80 (BP Location: Right Arm, Patient Position: Sitting)   Pulse 78   Temp 97.7 F (36.5 C) (Temporal)   Ht '5\' 6"'$  (1.676 m)   Wt 232 lb 12.8 oz (105.6 kg)   LMP 05/23/2008   SpO2 97% Comment: ra  BMI 37.57 kg/m   Brief Summary:  Sarah [name] is a 66 y.o. female with obstructive sleep apnea.      Subjective:   She uses CPAP on regular basis.  Has nasal mask.  Pressure setting is comfortable.  She is getting mouth dryness that wakes her up.  She doesn't feel like air is leaking from the mask.  She uses melatonin at night and reads in bed before falling asleep.  She naps for about 20 minutes in the afternoon.  She has tried increasing humidifier setting on her CPAP, but mouth still gets dry.  Physical Exam:   Appearance - well kempt   ENMT - no sinus tenderness, no oral exudate, no LAN, Mallampati 3 airway, no stridor  Respiratory - equal breath sounds bilaterally, no wheezing or rales  CV - s1s2 regular rate and rhythm, no murmurs  Ext - no clubbing, no edema  Skin - no rashes  Psych - normal mood and affect    Sleep Tests:  HST 04/17/20 >> AHI 16.5, SpO2 low 89% HST 05/19/22 >> AHI 11.1, SpO2 low 85%  Social History:  She  reports that she quit smoking about 25 years ago. Her smoking use included cigarettes. She has never used smokeless tobacco. She reports current alcohol use of about 1.0 standard drink of alcohol per week. She reports that she does not use drugs.  Family History:  Her family history includes Cancer in her father and mother; Stroke in her maternal grandmother and  paternal grandmother.     Assessment/Plan:   Obstructive sleep apnea. - she is compliant with CPAP and reports benefit from therapy - she uses [facility] for her DME - continue auto CPAP 5 to 15 cm H2O - will get a copy of her CPAP report and see if she can have her settings decreased to help with mouth dryness - she will try using her chin strap with nasal CPAP mask - if mouth dryness persists, she might need to be switched to a full face mask  Time Spent Involved in Patient Care on Day of Examination:  25 minutes  Follow up:   Patient Instructions  Try using a chin strap with your CPAP mask  Will call you with the results of your CPAP download   Follow up in 1 year  Medication List:   Allergies as of 07/29/2022       Reactions   Codeine Itching        Medication List        Accurate as of July 29, 2022 12:01 PM. If you have any questions, ask your nurse or doctor.          multivitamin tablet Take 1 tablet by mouth daily.   rosuvastatin 20 MG tablet Commonly known as: CRESTOR Take 20  mg by mouth at bedtime.        Signature:  Chesley Mires, MD [place] Pager - [phone] 07/29/2022, 12:01 PM

## 2022-07-29 NOTE — Patient Instructions (Signed)
Try using a chin strap with your CPAP mask  Will call you with the results of your CPAP download   Follow up in 1 year

## 2022-08-27 DIAGNOSIS — Z23 Encounter for immunization: Secondary | ICD-10-CM | POA: Diagnosis not present

## 2022-10-06 ENCOUNTER — Ambulatory Visit: Payer: Medicare HMO | Admitting: Pulmonary Disease

## 2022-11-24 DIAGNOSIS — Z86010 Personal history of colonic polyps: Secondary | ICD-10-CM | POA: Diagnosis not present

## 2022-11-24 DIAGNOSIS — Z1211 Encounter for screening for malignant neoplasm of colon: Secondary | ICD-10-CM | POA: Diagnosis not present

## 2022-11-24 DIAGNOSIS — R194 Change in bowel habit: Secondary | ICD-10-CM | POA: Diagnosis not present

## 2022-12-25 DIAGNOSIS — M10071 Idiopathic gout, right ankle and foot: Secondary | ICD-10-CM | POA: Diagnosis not present

## 2022-12-25 DIAGNOSIS — Z23 Encounter for immunization: Secondary | ICD-10-CM | POA: Diagnosis not present

## 2023-01-04 DIAGNOSIS — E785 Hyperlipidemia, unspecified: Secondary | ICD-10-CM | POA: Diagnosis not present

## 2023-01-04 DIAGNOSIS — N1831 Chronic kidney disease, stage 3a: Secondary | ICD-10-CM | POA: Diagnosis not present

## 2023-01-04 DIAGNOSIS — M109 Gout, unspecified: Secondary | ICD-10-CM | POA: Diagnosis not present

## 2023-01-04 DIAGNOSIS — G4733 Obstructive sleep apnea (adult) (pediatric): Secondary | ICD-10-CM | POA: Diagnosis not present

## 2023-01-04 DIAGNOSIS — Z79899 Other long term (current) drug therapy: Secondary | ICD-10-CM | POA: Diagnosis not present

## 2023-01-11 DIAGNOSIS — E785 Hyperlipidemia, unspecified: Secondary | ICD-10-CM | POA: Diagnosis not present

## 2023-01-11 DIAGNOSIS — M109 Gout, unspecified: Secondary | ICD-10-CM | POA: Diagnosis not present

## 2023-01-11 DIAGNOSIS — G4733 Obstructive sleep apnea (adult) (pediatric): Secondary | ICD-10-CM | POA: Diagnosis not present

## 2023-01-11 DIAGNOSIS — Z0000 Encounter for general adult medical examination without abnormal findings: Secondary | ICD-10-CM | POA: Diagnosis not present

## 2023-01-11 DIAGNOSIS — N1831 Chronic kidney disease, stage 3a: Secondary | ICD-10-CM | POA: Diagnosis not present

## 2023-01-20 DIAGNOSIS — K648 Other hemorrhoids: Secondary | ICD-10-CM | POA: Diagnosis not present

## 2023-01-20 DIAGNOSIS — Z86010 Personal history of colonic polyps: Secondary | ICD-10-CM | POA: Diagnosis not present

## 2023-01-20 DIAGNOSIS — K5730 Diverticulosis of large intestine without perforation or abscess without bleeding: Secondary | ICD-10-CM | POA: Diagnosis not present

## 2023-01-20 DIAGNOSIS — Z1211 Encounter for screening for malignant neoplasm of colon: Secondary | ICD-10-CM | POA: Diagnosis not present

## 2023-01-20 DIAGNOSIS — R194 Change in bowel habit: Secondary | ICD-10-CM | POA: Diagnosis not present

## 2023-01-21 ENCOUNTER — Encounter: Payer: Self-pay | Admitting: Radiology

## 2023-02-16 ENCOUNTER — Encounter: Payer: Self-pay | Admitting: Obstetrics & Gynecology

## 2023-02-16 ENCOUNTER — Other Ambulatory Visit (HOSPITAL_COMMUNITY)
Admission: RE | Admit: 2023-02-16 | Discharge: 2023-02-16 | Disposition: A | Discharge status: Home / Self Care | Payer: Medicare PPO | Source: Ambulatory Visit | Attending: Obstetrics & Gynecology | Admitting: Obstetrics & Gynecology

## 2023-02-16 ENCOUNTER — Ambulatory Visit: Payer: Medicare PPO | Admitting: Obstetrics & Gynecology

## 2023-02-16 VITALS — BP 135/81 | HR 70 | Ht 66.0 in | Wt 229.6 lb

## 2023-02-16 DIAGNOSIS — Z01419 Encounter for gynecological examination (general) (routine) without abnormal findings: Secondary | ICD-10-CM

## 2023-02-16 NOTE — Progress Notes (Signed)
   WELL-WOMAN EXAMINATION Patient name: Sarah [name] MRN [id number]  Date of birth: Dec 13, 1955 Chief Complaint:   Gynecologic Exam  History of Present Illness:   Sarah [name] is a 67 y.o. G0P0000 PM female being seen today for a routine well-woman exam.  Today she notes no issues or concerns.   Patient's last menstrual period was 05/23/2008.  Denies vaginal bleeding, discharge, itching or irritation.  Denies pelvic or abdominal pain.  Last pap 2021.  Last mammogram: 05/2022. Last colonoscopy: 2024     02/16/2023    2:02 PM 10/20/2021    2:28 PM  Depression screen PHQ 2/9  Decreased Interest 3 1  Down, Depressed, Hopeless 2 1  PHQ - 2 Score 5 2  Altered sleeping 2 1  Tired, decreased energy 1 0  Change in appetite 2 0  Feeling bad or failure about yourself  1 1  Trouble concentrating 0 0  Moving slowly or fidgety/restless 0 0  Suicidal thoughts 0 0  PHQ-9 Score 11 4      Review of Systems:   Pertinent items are noted in HPI Denies any headaches, blurred vision, fatigue, shortness of breath, chest pain, abdominal pain, bowel movements, urination, or intercourse unless otherwise stated above.  Pertinent History Reviewed:  Reviewed past medical,surgical, social and family history.  Reviewed problem list, medications and allergies. Physical Assessment:   Vitals:   02/16/23 1356  BP: 135/81  Pulse: 70  Weight: 229 lb 9.6 oz (104.1 kg)  Height: 5\' 6"  (1.676 m)  Body mass index is 37.06 kg/m.        Physical Examination:   General appearance - well appearing, and in no distress  Mental status - alert, oriented to person, place, and time  Psych:  She has a normal mood and affect  Skin - warm and dry, normal color, no suspicious lesions noted  Chest - effort normal, all lung fields clear to auscultation bilaterally  Heart - normal rate and regular rhythm  Neck:  midline trachea, no thyromegaly or nodules  Breasts - breasts appear normal, no suspicious masses, no  skin or nipple changes or  axillary nodes  Abdomen - soft, nontender, nondistended, no masses or organomegaly  Pelvic - VULVA: normal appearing vulva with no masses, tenderness or lesions  VAGINA: normal appearing vagina with normal color and discharge, no lesions  CERVIX: normal appearing cervix without discharge or lesions, no CMT  Thin prep pap is done with HR HPV cotesting  UTERUS: uterus is felt to be normal size, shape, consistency and nontender   ADNEXA: No adnexal masses or tenderness noted.  Extremities:  No swelling or varicosities noted  Chaperone:  Dr. Madison Hickman      Assessment & Plan:  1) Well-Woman Exam -pap collected, if negative today no further testing indicated -reviewed ASCCP guidelines -other screen testing up to date   Meds: No orders of the defined types were placed in this encounter.   Follow-up: Return in about 2 years (around 02/15/2025) for Annual.   Sarah Pupa, DO Attending [place], [facility] for [facility], [place]

## 2023-02-18 LAB — CYTOLOGY - PAP
Comment: NEGATIVE
Diagnosis: NEGATIVE
High risk HPV: NEGATIVE

## 2023-04-06 DIAGNOSIS — M1000 Idiopathic gout, unspecified site: Secondary | ICD-10-CM | POA: Diagnosis not present

## 2023-04-14 DIAGNOSIS — M109 Gout, unspecified: Secondary | ICD-10-CM | POA: Diagnosis not present

## 2023-04-14 DIAGNOSIS — G4733 Obstructive sleep apnea (adult) (pediatric): Secondary | ICD-10-CM | POA: Diagnosis not present

## 2023-05-24 ENCOUNTER — Telehealth: Payer: Self-pay | Admitting: Pulmonary Disease

## 2023-05-24 DIAGNOSIS — G4733 Obstructive sleep apnea (adult) (pediatric): Secondary | ICD-10-CM

## 2023-05-24 NOTE — Telephone Encounter (Signed)
Patient states puppy tore face mask to pieces. Would like order for face mask. Patient uses [facility] for CPAP machine supplies. Patient phone number is [phone].

## 2023-05-28 NOTE — Telephone Encounter (Signed)
Okay to send order for new CPAP mask. 

## 2023-05-28 NOTE — Telephone Encounter (Signed)
Order for new CPAP mask has been put in.   Nothing further needed.

## 2023-06-14 DIAGNOSIS — H5213 Myopia, bilateral: Secondary | ICD-10-CM | POA: Diagnosis not present

## 2023-08-11 DIAGNOSIS — N1831 Chronic kidney disease, stage 3a: Secondary | ICD-10-CM | POA: Diagnosis not present

## 2023-08-11 DIAGNOSIS — Z79899 Other long term (current) drug therapy: Secondary | ICD-10-CM | POA: Diagnosis not present

## 2023-08-11 DIAGNOSIS — E785 Hyperlipidemia, unspecified: Secondary | ICD-10-CM | POA: Diagnosis not present

## 2023-08-11 DIAGNOSIS — G4733 Obstructive sleep apnea (adult) (pediatric): Secondary | ICD-10-CM | POA: Diagnosis not present

## 2023-08-11 DIAGNOSIS — M1000 Idiopathic gout, unspecified site: Secondary | ICD-10-CM | POA: Diagnosis not present

## 2023-08-18 DIAGNOSIS — G4733 Obstructive sleep apnea (adult) (pediatric): Secondary | ICD-10-CM | POA: Diagnosis not present

## 2023-08-18 DIAGNOSIS — Z23 Encounter for immunization: Secondary | ICD-10-CM | POA: Diagnosis not present

## 2023-08-18 DIAGNOSIS — N183 Chronic kidney disease, stage 3 unspecified: Secondary | ICD-10-CM | POA: Diagnosis not present

## 2023-08-23 ENCOUNTER — Encounter: Payer: Self-pay | Admitting: Pulmonary Disease

## 2023-08-23 ENCOUNTER — Ambulatory Visit (INDEPENDENT_AMBULATORY_CARE_PROVIDER_SITE_OTHER): Payer: Medicare HMO | Admitting: Pulmonary Disease

## 2023-08-23 VITALS — BP 119/81 | HR 76 | Ht 66.0 in | Wt 215.0 lb

## 2023-08-23 DIAGNOSIS — G4733 Obstructive sleep apnea (adult) (pediatric): Secondary | ICD-10-CM | POA: Diagnosis not present

## 2023-08-23 NOTE — Patient Instructions (Signed)
Will have your auto CPAP setting changed to 5 - 10 cm water pressure.  Follow up in 1 year.

## 2023-08-23 NOTE — Progress Notes (Addendum)
   [place] Pulmonary, Critical Care, and Sleep Medicine  Chief Complaint  Patient presents with   Follow-up    Past Surgical History:  She  has a past surgical history that includes Colonoscopy; Finger surgery; and Mouth surgery.  Past Medical History:  GERD, HLD  Constitutional:  BP 119/81   Pulse 76   Ht 5\' 6"  (1.676 m)   Wt 215 lb (97.5 kg)   LMP 05/23/2008   SpO2 97%   BMI 34.70 kg/m   Brief Summary:  Sarah [name] is a 67 y.o. female with obstructive sleep apnea.      Subjective:   She got a puppy several months ago.  This has kept her more active.  She has lost about 30 lbs since 2023.  She doesn't feel like her CPAP settings are where they need to be.  Mask is comfortable.  She feels that CPAP still helps.  Physical Exam:   Appearance - well kempt   ENMT - no sinus tenderness, no oral exudate, no LAN, Mallampati 3 airway, no stridor  Respiratory - equal breath sounds bilaterally, no wheezing or rales  CV - s1s2 regular rate and rhythm, no murmurs  Ext - no clubbing, no edema  Skin - no rashes  Psych - normal mood and affect    Sleep Tests:  HST 04/17/20 >> AHI 16.5, SpO2 low 89% HST 05/19/22 >> AHI 11.1, SpO2 low 85%  Social History:  She  reports that she quit smoking about 26 years ago. Her smoking use included cigarettes. She has never used smokeless tobacco. She reports current alcohol use of about 1.0 standard drink of alcohol per week. She reports that she does not use drugs.  Family History:  Her family history includes Cancer in her father and mother; Stroke in her maternal grandmother and paternal grandmother.     Assessment/Plan:   Obstructive sleep apnea. - she is compliant with CPAP and reports benefit from therapy - she uses [facility] for her DME - suspect she needs lower settings after recent weight loss - will change to auto CPAP 5 to 10 cm H2O - explained if she continues to lose weight then we could repeat a home sleep  study at some point to see if she still has significant sleep apnea  Time Spent Involved in Patient Care on Day of Examination:  16 minutes  Follow up:   Patient Instructions  Will have your auto CPAP setting changed to 5 - 10 cm water pressure  Follow up in 1 year  Medication List:   Allergies as of 08/23/2023       Reactions   Codeine Itching        Medication List        Accurate as of August 23, 2023 11:56 AM. If you have any questions, ask your nurse or doctor.          Linzess 290 MCG Caps capsule Generic drug: linaclotide Take by mouth every morning.   melatonin 5 MG Tabs Take 5 mg by mouth.   multivitamin tablet Take 1 tablet by mouth daily.   rosuvastatin 20 MG tablet Commonly known as: CRESTOR Take 20 mg by mouth at bedtime.        Signature:  Coralyn Helling, MD [facility] Pulmonary/Critical Care Pager - [phone] 08/23/2023, 11:56 AM

## 2023-09-09 ENCOUNTER — Telehealth: Payer: Self-pay | Admitting: Pulmonary Disease

## 2023-09-09 DIAGNOSIS — G4733 Obstructive sleep apnea (adult) (pediatric): Secondary | ICD-10-CM

## 2023-09-09 NOTE — Telephone Encounter (Signed)
Pt called in to get her CPAP fixed informed her to reach out to DME, Pt states they dont know anything.

## 2023-09-10 NOTE — Telephone Encounter (Signed)
Spoke with patient. She states that it went back to working. It is over 67 years old. Referral for new machine and new DME company placed. She is not happy with [facility].

## 2023-11-15 DIAGNOSIS — N1831 Chronic kidney disease, stage 3a: Secondary | ICD-10-CM | POA: Diagnosis not present

## 2023-11-22 DIAGNOSIS — G4733 Obstructive sleep apnea (adult) (pediatric): Secondary | ICD-10-CM | POA: Diagnosis not present

## 2023-11-22 DIAGNOSIS — N1831 Chronic kidney disease, stage 3a: Secondary | ICD-10-CM | POA: Diagnosis not present

## 2023-11-22 DIAGNOSIS — R413 Other amnesia: Secondary | ICD-10-CM | POA: Diagnosis not present

## 2024-02-14 DIAGNOSIS — G4733 Obstructive sleep apnea (adult) (pediatric): Secondary | ICD-10-CM | POA: Diagnosis not present

## 2024-02-14 DIAGNOSIS — Z79899 Other long term (current) drug therapy: Secondary | ICD-10-CM | POA: Diagnosis not present

## 2024-02-14 DIAGNOSIS — R413 Other amnesia: Secondary | ICD-10-CM | POA: Diagnosis not present

## 2024-02-14 DIAGNOSIS — N1831 Chronic kidney disease, stage 3a: Secondary | ICD-10-CM | POA: Diagnosis not present

## 2024-06-16 DIAGNOSIS — M1000 Idiopathic gout, unspecified site: Secondary | ICD-10-CM | POA: Diagnosis not present

## 2024-06-16 DIAGNOSIS — Z79899 Other long term (current) drug therapy: Secondary | ICD-10-CM | POA: Diagnosis not present

## 2024-06-16 DIAGNOSIS — N1831 Chronic kidney disease, stage 3a: Secondary | ICD-10-CM | POA: Diagnosis not present

## 2024-06-16 DIAGNOSIS — G4733 Obstructive sleep apnea (adult) (pediatric): Secondary | ICD-10-CM | POA: Diagnosis not present

## 2024-06-16 DIAGNOSIS — E785 Hyperlipidemia, unspecified: Secondary | ICD-10-CM | POA: Diagnosis not present

## 2024-07-19 DIAGNOSIS — Z008 Encounter for other general examination: Secondary | ICD-10-CM | POA: Diagnosis not present

## 2024-07-19 DIAGNOSIS — G4733 Obstructive sleep apnea (adult) (pediatric): Secondary | ICD-10-CM | POA: Diagnosis not present

## 2024-07-19 DIAGNOSIS — N1831 Chronic kidney disease, stage 3a: Secondary | ICD-10-CM | POA: Diagnosis not present

## 2024-07-19 DIAGNOSIS — F17210 Nicotine dependence, cigarettes, uncomplicated: Secondary | ICD-10-CM | POA: Diagnosis not present

## 2024-07-19 DIAGNOSIS — E785 Hyperlipidemia, unspecified: Secondary | ICD-10-CM | POA: Diagnosis not present

## 2024-07-21 ENCOUNTER — Other Ambulatory Visit (HOSPITAL_COMMUNITY): Payer: Self-pay | Admitting: Internal Medicine

## 2024-07-21 DIAGNOSIS — Z1231 Encounter for screening mammogram for malignant neoplasm of breast: Secondary | ICD-10-CM

## 2024-07-27 ENCOUNTER — Encounter (HOSPITAL_COMMUNITY): Payer: Self-pay

## 2024-07-27 ENCOUNTER — Ambulatory Visit (HOSPITAL_COMMUNITY)
Admission: RE | Admit: 2024-07-27 | Discharge: 2024-07-27 | Disposition: A | Discharge status: Home / Self Care | Source: Ambulatory Visit | Attending: Internal Medicine | Admitting: Internal Medicine

## 2024-07-27 DIAGNOSIS — Z1231 Encounter for screening mammogram for malignant neoplasm of breast: Secondary | ICD-10-CM | POA: Diagnosis not present
